# Patient Record
Sex: Male | Born: 1965 | Race: White | Hispanic: No | Marital: Married | Smoking: Never smoker
Health system: Southern US, Community
[De-identification: ages and names within clinical notes are randomized; demographics above are authoritative.]

## PROBLEM LIST (undated history)

## (undated) DIAGNOSIS — S62509A Fracture of unspecified phalanx of unspecified thumb, initial encounter for closed fracture: Secondary | ICD-10-CM

## (undated) DIAGNOSIS — K649 Unspecified hemorrhoids: Secondary | ICD-10-CM

## (undated) DIAGNOSIS — K219 Gastro-esophageal reflux disease without esophagitis: Secondary | ICD-10-CM

## (undated) DIAGNOSIS — S8290XA Unspecified fracture of unspecified lower leg, initial encounter for closed fracture: Secondary | ICD-10-CM

## (undated) DIAGNOSIS — K645 Perianal venous thrombosis: Secondary | ICD-10-CM

## (undated) HISTORY — PX: VASECTOMY: SHX75

## (undated) HISTORY — DX: Fracture of unspecified phalanx of unspecified thumb, initial encounter for closed fracture: S62.509A

## (undated) HISTORY — DX: Perianal venous thrombosis: K64.5

## (undated) HISTORY — DX: Unspecified hemorrhoids: K64.9

## (undated) HISTORY — DX: Unspecified fracture of unspecified lower leg, initial encounter for closed fracture: S82.90XA

## (undated) HISTORY — PX: INCISION AND DRAINAGE: SHX5863

## (undated) HISTORY — DX: Gastro-esophageal reflux disease without esophagitis: K21.9

---
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
## 1992-09-15 HISTORY — PX: ABSCESS DRAINAGE: SHX1119

## 2013-12-21 ENCOUNTER — Encounter: Payer: Self-pay | Admitting: *Deleted

## 2014-01-12 ENCOUNTER — Encounter: Payer: Self-pay | Admitting: Specialist

## 2014-02-11 ENCOUNTER — Encounter: Payer: Self-pay | Admitting: Specialist

## 2014-08-30 ENCOUNTER — Encounter: Payer: Self-pay | Admitting: *Deleted

## 2014-08-31 ENCOUNTER — Encounter: Payer: Self-pay | Admitting: *Deleted

## 2014-09-07 ENCOUNTER — Ambulatory Visit (INDEPENDENT_AMBULATORY_CARE_PROVIDER_SITE_OTHER): Payer: 59 | Admitting: General Surgery

## 2014-09-07 ENCOUNTER — Encounter: Payer: Self-pay | Admitting: General Surgery

## 2014-09-07 VITALS — BP 118/80 | HR 64 | Resp 12 | Ht 72.0 in | Wt 244.0 lb

## 2014-09-07 DIAGNOSIS — L989 Disorder of the skin and subcutaneous tissue, unspecified: Secondary | ICD-10-CM

## 2014-09-07 NOTE — Patient Instructions (Signed)
Keep area clean 

## 2014-09-07 NOTE — Progress Notes (Signed)
Patient ID: Ralph Patrick, male   DOB: [birth year], 48 y.o.   MRN: [id number]  Chief Complaint  Patient presents with  . Procedure    excision scalp lesion     HPI MCIHAEL Patrick is a 48 y.o. male. Here today to have a excision for scalp lesion removed. [name] has had a small pigmented lesion in left side of scalp. It has been a problem with shaving and also with mild local discomfort. HPI  Past Medical History  Diagnosis Date  . Thumb fracture   . Lower leg fracture 1969    right lower leg  . Hemorrhoid   . Thrombosed hemorrhoids 08-25-08 and 10-02-09  . GERD (gastroesophageal reflux disease)     Past Surgical History  Procedure Laterality Date  . Tonsillectomy  1972  . Vasectomy    . Abscess drainage  09-15-92    perianal  . Incision and drainage  2009, 2010    hemorrhoids    History reviewed. No pertinent family history.  Social History History  Substance Use Topics  . Smoking status: Never Smoker   . Smokeless tobacco: Never Used  . Alcohol Use: No    No Known Allergies  Current Outpatient Prescriptions  Medication Sig Dispense Refill  . docusate sodium (COLACE) 100 MG capsule Take 100 mg by mouth daily as needed for mild constipation.    Marland Kitchen omeprazole (PRILOSEC) 20 MG capsule Take 20 mg by mouth as needed.    . polyethylene glycol (MIRALAX / GLYCOLAX) packet Take 17 g by mouth daily.     No current facility-administered medications for this visit.    Review of Systems Review of Systems  Constitutional: Negative.   Respiratory: Negative.   Cardiovascular: Negative.     Blood pressure 118/80, pulse 64, resp. rate 12, height 6' (1.829 m), weight 244 lb (110.678 kg).  Physical Exam Physical Exam  Constitutional: He is oriented to person, place, and time. He appears well-developed and well-nourished.  Neurological: He is alert and oriented to person, place, and time.  Skin: Skin is warm and dry.  On left side of scalp there is a 4-46mm pink raised nodule.    Data Reviewed none  Assessment    Symptomatic scalp skin lesion.     Plan   Excision discussed and complete with his consent.    Procedure: Area infiltrated with 1%xylocaine mixed with 0.5% marcaine, 6ml. Prepped with Chloro Prep. Lesion excised with 49mm margin. Base cauterised with disposable cautery. Skin closed with interrupted 5-0 Prolene.  Neoporin oint, telfa and tegaderm. Sutures can be removed in 1 week. No immediate problems from procedure.  Follow up as needed.  [name] 09/12/2014, 6:33 AM

## 2014-09-09 LAB — PATHOLOGY

## 2014-09-12 ENCOUNTER — Telehealth: Payer: Self-pay | Admitting: *Deleted

## 2014-09-12 ENCOUNTER — Encounter: Payer: Self-pay | Admitting: General Surgery

## 2014-09-12 NOTE — Telephone Encounter (Signed)
Patient notified of pathology results and verbalizes understanding.

## 2014-09-12 NOTE — Telephone Encounter (Signed)
-----   Message from Christene Lye, MD sent at 09/12/2014  6:54 AM EST ----- Path reviewed.  Please inform pt - path is benign

## 2015-10-19 DIAGNOSIS — J069 Acute upper respiratory infection, unspecified: Secondary | ICD-10-CM | POA: Diagnosis not present

## 2015-10-19 DIAGNOSIS — H6982 Other specified disorders of Eustachian tube, left ear: Secondary | ICD-10-CM | POA: Diagnosis not present

## 2015-11-26 DIAGNOSIS — M545 Low back pain: Secondary | ICD-10-CM | POA: Diagnosis not present

## 2017-07-17 ENCOUNTER — Encounter: Payer: Self-pay | Admitting: General Surgery

## 2017-07-17 ENCOUNTER — Ambulatory Visit (INDEPENDENT_AMBULATORY_CARE_PROVIDER_SITE_OTHER): Payer: 59 | Admitting: General Surgery

## 2017-07-17 VITALS — BP 110/80 | HR 73 | Resp 12 | Ht 72.0 in | Wt 240.0 lb

## 2017-07-17 DIAGNOSIS — R202 Paresthesia of skin: Secondary | ICD-10-CM

## 2017-07-17 DIAGNOSIS — G629 Polyneuropathy, unspecified: Secondary | ICD-10-CM

## 2017-07-17 DIAGNOSIS — Z1211 Encounter for screening for malignant neoplasm of colon: Secondary | ICD-10-CM | POA: Diagnosis not present

## 2017-07-17 LAB — GLUCOSE, POCT (MANUAL RESULT ENTRY): POC Glucose: 104 mg/dl — AB (ref 70–99)

## 2017-07-17 MED ORDER — PREGABALIN 25 MG PO CAPS: 25.0000 mg | ORAL_CAPSULE | Freq: Two times a day (BID) | ORAL | 0 refills | Status: DC

## 2017-07-17 NOTE — Progress Notes (Signed)
Patient ID: Ralph Patrick, male   DOB: 1966/09/01, 51 y.o.   MRN: [id number]  Chief Complaint  Patient presents with  . Other    HPI Ralph Patrick is a 51 y.o. male here today for a evaluation of bilateral foot tingling and pain. Described as "bumble bee" stings and numbness. Patient states he started a new job about a month ago and the pain has got worse while walking . He has tried aleve and ibuprofen, and epsom salt soaks. He is wearing compression hose and bought new shoes as well to try to help the pain. He does admit to occasional "dizzy spells" yesterday and today.  HPI    Past Medical History:  Diagnosis Date  . GERD (gastroesophageal reflux disease)   . Hemorrhoid   . Lower leg fracture 1969   right lower leg  . Thrombosed hemorrhoids 08-25-08 and 10-02-09  . Thumb fracture     Past Surgical History:  Procedure Laterality Date  . ABSCESS DRAINAGE  09-15-92   perianal  . INCISION AND DRAINAGE  2009, 2010   hemorrhoids  . TONSILLECTOMY  1972  . VASECTOMY      No family history on file.  Social History Social History  Substance Use Topics  . Smoking status: Never Smoker  . Smokeless tobacco: Never Used  . Alcohol use No    No Known Allergies  Current Outpatient Prescriptions  Medication Sig Dispense Refill  . docusate sodium (COLACE) 100 MG capsule Take 100 mg by mouth daily as needed for mild constipation.    Marland Kitchen omeprazole (PRILOSEC) 20 MG capsule Take 20 mg by mouth as needed.    . polyethylene glycol (MIRALAX / GLYCOLAX) packet Take 17 g by mouth daily.    . pregabalin (LYRICA) 25 MG capsule Take 1 capsule (25 mg total) by mouth 2 (two) times daily. 60 capsule 0   No current facility-administered medications for this visit.     Review of Systems Review of Systems  Constitutional: Negative.   Respiratory: Negative.   Cardiovascular: Negative.     Blood pressure 110/80, pulse 73, resp. rate 12, height 6' (1.829 m), weight 240 lb (108.9  kg).  Physical Exam Physical Exam  Constitutional: He is oriented to person, place, and time. He appears well-developed and well-nourished.  Cardiovascular: Intact distal pulses.   Pulses:      Dorsalis pedis pulses are 2+ on the right side, and 2+ on the left side.       Posterior tibial pulses are 2+ on the right side, and 2+ on the left side.  No lower leg edema. Feet are warm, brisk cap refill. No VV or skin changes  Neurological: He is alert and oriented to person, place, and time.  Skin: Skin is warm and dry.  Psychiatric: His behavior is normal.    Data Reviewed   Assessment    Blood sugar 104 today- not a fasting sample. 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    Plan    Recommend trial of lyrica 25 mg BID #60. Continue compression hose. He is agreeable to getting established with a PCP and also a colonoscopy. Colonoscopy with possible biopsy/polypectomy prn: Information regarding the procedure, including its potential risks and complications (including but not limited to perforation of the bowel, which may require emergency surgery to repair, and bleeding) was verbally given to the  patient. Educational information regarding lower intestinal endoscopy was given to the patient. Written instructions for how to complete the bowel prep using Miralax were provided. The importance of drinking ample fluids to avoid dehydration as a result of the prep emphasized.  He will call back to schedule colonoscopy. Will arrange for PCP      HPI, Physical Exam, Assessment and Plan have been scribed under the direction and in the presence of Ralph Jewel, MD  Ralph Patrick, CMA  I have completed the exam and reviewed the above documentation for accuracy and completeness.  I agree with the above.  Haematologist has been used and any errors in dictation or  transcription are unintentional.  Ralph Patrick G. Jamal Collin, M.D., F.A.C.S.   Ralph Patrick G 07/18/2017, 10:25 AM

## 2017-07-17 NOTE — Patient Instructions (Signed)
Colonoscopy, Adult A colonoscopy is an exam to look at the entire large intestine. During the exam, a lubricated, bendable tube is inserted into the anus and then passed into the rectum, colon, and other parts of the large intestine. A colonoscopy is often done as a part of normal colorectal screening or in response to certain symptoms, such as anemia, persistent diarrhea, abdominal pain, and blood in the stool. The exam can help screen for and diagnose medical problems, including:  Tumors.  Polyps.  Inflammation.  Areas of bleeding.  Tell a health care provider about:  Any allergies you have.  All medicines you are taking, including vitamins, herbs, eye drops, creams, and over-the-counter medicines.  Any problems you or family members have had with anesthetic medicines.  Any blood disorders you have.  Any surgeries you have had.  Any medical conditions you have.  Any problems you have had passing stool. What are the risks? Generally, this is a safe procedure. However, problems may occur, including:  Bleeding.  A tear in the intestine.  A reaction to medicines given during the exam.  Infection (rare).  What happens before the procedure? Eating and drinking restrictions Follow instructions from your health care provider about eating and drinking, which may include:  A few days before the procedure - follow a low-fiber diet. Avoid nuts, seeds, dried fruit, raw fruits, and vegetables.  1-3 days before the procedure - follow a clear liquid diet. Drink only clear liquids, such as clear broth or bouillon, black coffee or tea, clear juice, clear soft drinks or sports drinks, gelatin dessert, and popsicles. Avoid any liquids that contain red or purple dye.  On the day of the procedure - do not eat or drink anything during the 2 hours before the procedure, or within the time period that your health care provider recommends.  Bowel prep If you were prescribed an oral bowel prep  to clean out your colon:  Take it as told by your health care provider. Starting the day before your procedure, you will need to drink a large amount of medicated liquid. The liquid will cause you to have multiple loose stools until your stool is almost clear or light green.  If your skin or anus gets irritated from diarrhea, you may use these to relieve the irritation: ? Medicated wipes, such as adult wet wipes with aloe and vitamin E. ? A skin soothing-product like petroleum jelly.  If you vomit while drinking the bowel prep, take a break for up to 60 minutes and then begin the bowel prep again. If vomiting continues and you cannot take the bowel prep without vomiting, call your health care provider.  General instructions  Ask your health care provider about changing or stopping your regular medicines. This is especially important if you are taking diabetes medicines or blood thinners.  Plan to have someone take you home from the hospital or clinic. What happens during the procedure?  An IV tube may be inserted into one of your veins.  You will be given medicine to help you relax (sedative).  To reduce your risk of infection: ? Your health care team will wash or sanitize their hands. ? Your anal area will be washed with soap.  You will be asked to lie on your side with your knees bent.  Your health care provider will lubricate a long, thin, flexible tube. The tube will have a camera and a light on the end.  The tube will be inserted into your   anus.  The tube will be gently eased through your rectum and colon.  Air will be delivered into your colon to keep it open. You may feel some pressure or cramping.  The camera will be used to take images during the procedure.  A small tissue sample may be removed from your body to be examined under a microscope (biopsy). If any potential problems are found, the tissue will be sent to a lab for testing.  If small polyps are found, your  health care provider may remove them and have them checked for cancer cells.  The tube that was inserted into your anus will be slowly removed. The procedure may vary among health care providers and hospitals. What happens after the procedure?  Your blood pressure, heart rate, breathing rate, and blood oxygen level will be monitored until the medicines you were given have worn off.  Do not drive for 24 hours after the exam.  You may have a small amount of blood in your stool.  You may pass gas and have mild abdominal cramping or bloating due to the air that was used to inflate your colon during the exam.  It is up to you to get the results of your procedure. Ask your health care provider, or the department performing the procedure, when your results will be ready. This information is not intended to replace advice given to you by your health care provider. Make sure you discuss any questions you have with your health care provider. Document Released: 09/27/2000 Document Revised: 07/31/2016 Document Reviewed: 12/12/2015 Elsevier Interactive Patient Education  2018 Elsevier Inc[employer]  

## 2017-08-12 ENCOUNTER — Ambulatory Visit: Payer: Self-pay | Admitting: Family Medicine

## 2017-08-13 ENCOUNTER — Ambulatory Visit: Payer: Self-pay | Admitting: Family Medicine

## 2017-09-10 ENCOUNTER — Ambulatory Visit: Payer: Self-pay | Admitting: Family Medicine

## 2017-09-17 ENCOUNTER — Telehealth: Payer: Self-pay | Admitting: *Deleted

## 2017-09-17 MED ORDER — LIDOCAINE HCL 2 % EX GEL: 1.0000 "application " | CUTANEOUS | 1 refills | Status: DC | PRN

## 2017-09-17 NOTE — Telephone Encounter (Signed)
Rx sent 

## 2017-09-22 ENCOUNTER — Ambulatory Visit: Payer: Self-pay | Admitting: Family Medicine

## 2017-09-25 ENCOUNTER — Encounter: Payer: Self-pay | Admitting: General Surgery

## 2017-09-25 ENCOUNTER — Ambulatory Visit (INDEPENDENT_AMBULATORY_CARE_PROVIDER_SITE_OTHER): Payer: 59 | Admitting: General Surgery

## 2017-09-25 VITALS — BP 122/68 | HR 65 | Resp 12 | Ht 72.0 in | Wt 242.0 lb

## 2017-09-25 DIAGNOSIS — K645 Perianal venous thrombosis: Secondary | ICD-10-CM

## 2017-09-25 NOTE — Patient Instructions (Signed)
The patient is aware to call back for any questions or concerns.  

## 2017-09-25 NOTE — Progress Notes (Signed)
Patient ID: Ralph Patrick, male   DOB: 06/22/1966, 51 y.o.   MRN: [id number]  Chief Complaint  Patient presents with  . Follow-up    HPI Ralph Patrick is a 51 y.o. male.  Here for follow up thrombosed hemorrhoid. He states he has improved. About 10-12 days ago the patient had presented with an acutely inflamed hemorrhoid on the left side.  And also had multiple clots.  That time the hemorrhoid was lanced and several pieces of clots were removed.  He has since been using and out pram cream and it has gradually improved but not fully resolved. HPI  Past Medical History:  Diagnosis Date  . GERD (gastroesophageal reflux disease)   . Hemorrhoid   . Lower leg fracture 1969   right lower leg  . Thrombosed hemorrhoids 08-25-08 and 10-02-09  . Thumb fracture     Past Surgical History:  Procedure Laterality Date  . ABSCESS DRAINAGE  09-15-92   perianal  . INCISION AND DRAINAGE  2009, 2010   hemorrhoids  . TONSILLECTOMY  1972  . VASECTOMY      No family history on file.  Social History Social History   Tobacco Use  . Smoking status: Never Smoker  . Smokeless tobacco: Never Used  Substance Use Topics  . Alcohol use: No    Alcohol/week: 0.0 oz  . Drug use: No    No Known Allergies  Current Outpatient Medications  Medication Sig Dispense Refill  . docusate sodium (COLACE) 100 MG capsule Take 100 mg by mouth daily as needed for mild constipation.    . lidocaine (XYLOCAINE) 2 % jelly Apply 1 application topically as needed. 30 mL 1  . omeprazole (PRILOSEC) 20 MG capsule Take 20 mg by mouth as needed.    . polyethylene glycol (MIRALAX / GLYCOLAX) packet Take 17 g by mouth daily.    . pregabalin (LYRICA) 25 MG capsule Take 1 capsule (25 mg total) by mouth 2 (two) times daily. (Patient taking differently: Take 25 mg by mouth as needed. ) 60 capsule 0   No current facility-administered medications for this visit.     Review of Systems Review of Systems  Constitutional:  Negative.   Respiratory: Negative.   Cardiovascular: Negative.     Blood pressure 122/68, pulse 65, resp. rate 12, height 6' (1.829 m), weight 242 lb (109.8 kg).  Physical Exam Physical Exam  Constitutional: He is oriented to person, place, and time. He appears well-developed and well-nourished.  Neurological: He is alert and oriented to person, place, and time.  Skin: Skin is warm and dry.  Psychiatric: His behavior is normal.  Rectal exam shows again a prominent left external hemorrhoid which is still mildly inflamed but much smaller than it was last time.  The small opening that was made for clots to be removed was still identified and with consent this was reopened and several more pieces of clots were removed. Data Reviewed   Assessment    Episode of thrombosed external hemorrhoid on the left posterior incision and evacuation of clots- it is improving.  Advised to continue using the prime cream until this is fully resolved.  No need for surgical intervention since he has had only 2 episodes of this problem over many years now    Plan    Follow-up as needed.  Discussed colonoscopy or at least doing a colon guard testing.  Patient did decide on this     HPI, Physical Exam, Assessment and Plan have been  scribed under the direction and in the presence of Mckinley Jewel, MD  Gaspar Cola, CMA I have completed the exam and reviewed the above documentation for accuracy and completeness.  I agree with the above.  Haematologist has been used and any errors in dictation or transcription are unintentional.  Seeplaputhur G. Jamal Collin, M.D., F.A.C.S.   Junie Panning G 09/29/2017, 9:53 AM

## 2017-10-16 ENCOUNTER — Encounter: Payer: Self-pay | Admitting: Family Medicine

## 2017-10-16 ENCOUNTER — Other Ambulatory Visit: Payer: Self-pay

## 2017-10-16 ENCOUNTER — Ambulatory Visit (INDEPENDENT_AMBULATORY_CARE_PROVIDER_SITE_OTHER): Payer: 59 | Admitting: Family Medicine

## 2017-10-16 VITALS — BP 108/62 | HR 68 | Temp 98.2°F | Resp 14 | Ht 72.0 in | Wt 243.8 lb

## 2017-10-16 DIAGNOSIS — Z1211 Encounter for screening for malignant neoplasm of colon: Secondary | ICD-10-CM

## 2017-10-16 DIAGNOSIS — Z7689 Persons encountering health services in other specified circumstances: Secondary | ICD-10-CM | POA: Diagnosis not present

## 2017-10-16 DIAGNOSIS — Z125 Encounter for screening for malignant neoplasm of prostate: Secondary | ICD-10-CM | POA: Diagnosis not present

## 2017-10-16 DIAGNOSIS — K219 Gastro-esophageal reflux disease without esophagitis: Secondary | ICD-10-CM | POA: Diagnosis not present

## 2017-10-16 DIAGNOSIS — Z0000 Encounter for general adult medical examination without abnormal findings: Secondary | ICD-10-CM

## 2017-10-16 DIAGNOSIS — R0683 Snoring: Secondary | ICD-10-CM

## 2017-10-16 DIAGNOSIS — Z6833 Body mass index (BMI) 33.0-33.9, adult: Secondary | ICD-10-CM | POA: Diagnosis not present

## 2017-10-16 LAB — POCT URINALYSIS DIPSTICK
Bilirubin, UA: NEGATIVE
Blood, UA: NEGATIVE
Glucose, UA: NEGATIVE
Ketones, UA: NEGATIVE
Leukocytes, UA: NEGATIVE
Nitrite, UA: NEGATIVE
Protein, UA: NEGATIVE
Spec Grav, UA: 1.015
Urobilinogen, UA: 0.2 U/dL
pH, UA: 6

## 2017-10-16 NOTE — Progress Notes (Signed)
Ralph Patrick  MRN: [id number] DOB: 24-Apr-1966  Subjective:  HPI  Patient is here to get established. Patient has not had a PCP for years. Saw Dr Glennon Hamilton years ago. Patient states he is not having any particular issues. Patient is ready to get Colonoscopy set up.For CPE today.He is married,works as Production assistant, radio for [employer] and has daughter -24,and son--19.  Wt Readings from Last 3 Encounters:  10/16/17 243 lb 12.8 oz (110.6 kg)  09/25/17 242 lb (109.8 kg)  07/17/17 240 lb (108.9 kg)    Patient Active Problem List   Diagnosis Date Noted  . GERD (gastroesophageal reflux disease) 10/16/2017    Past Medical History:  Diagnosis Date  . GERD (gastroesophageal reflux disease)   . Hemorrhoid   . Lower leg fracture 1969   right lower leg  . Thrombosed hemorrhoids 08-25-08 and 10-02-09  . Thumb fracture     Social History   Socioeconomic History  . Marital status: Married    Spouse name: Not on file  . Number of children: Not on file  . Years of education: Not on file  . Highest education level: Not on file  Social Needs  . Financial resource strain: Not on file  . Food insecurity - worry: Not on file  . Food insecurity - inability: Not on file  . Transportation needs - medical: Not on file  . Transportation needs - non-medical: Not on file  Occupational History  . Not on file  Tobacco Use  . Smoking status: Never Smoker  . Smokeless tobacco: Never Used  Substance and Sexual Activity  . Alcohol use: No    Alcohol/week: 0.0 oz  . Drug use: No  . Sexual activity: Not on file  Other Topics Concern  . Not on file  Social History Narrative  . Not on file    Outpatient Encounter Medications as of 10/16/2017  Medication Sig  . calcium carbonate (TUMS) 500 MG chewable tablet Chew 1 tablet by mouth daily as needed for indigestion or heartburn.  . docusate sodium (COLACE) 100 MG capsule Take 100 mg by mouth daily as needed for mild constipation.  . [DISCONTINUED]  lidocaine (XYLOCAINE) 2 % jelly Apply 1 application topically as needed.  . [DISCONTINUED] omeprazole (PRILOSEC) 20 MG capsule Take 20 mg by mouth as needed.  . [DISCONTINUED] polyethylene glycol (MIRALAX / GLYCOLAX) packet Take 17 g by mouth daily.  . [DISCONTINUED] pregabalin (LYRICA) 25 MG capsule Take 1 capsule (25 mg total) by mouth 2 (two) times daily. (Patient taking differently: Take 25 mg by mouth as needed. )   No facility-administered encounter medications on file as of 10/16/2017.     No Known Allergies  Review of Systems  Constitutional: Negative.   HENT: Positive for hearing loss.   Respiratory: Negative.   Cardiovascular: Negative.   Gastrointestinal: Positive for heartburn. Negative for nausea and vomiting.  Musculoskeletal: Negative.   Neurological: Negative.     Objective:  BP 108/62   Pulse 68   Temp 98.2 F (36.8 C)   Resp 14   Ht 6' (1.829 m)   Wt 243 lb 12.8 oz (110.6 kg)   BMI 33.07 kg/m   Physical Exam  Constitutional: He is oriented to person, place, and time and well-developed, well-nourished, and in no distress.  HENT:  Head: Normocephalic and atraumatic.  Right Ear: External ear normal.  Left Ear: External ear normal.  Nose: Nose normal.  Mouth/Throat: Oropharynx is clear and moist.  Eyes: Conjunctivae are normal.  Pupils are equal, round, and reactive to light. No scleral icterus.  Neck: Neck supple. No thyromegaly present.  Cardiovascular: Normal rate, regular rhythm, normal heart sounds and intact distal pulses.  Pulmonary/Chest: Effort normal and breath sounds normal.  Abdominal: Soft.  Genitourinary: Rectum normal, prostate normal and penis normal.  Musculoskeletal: Normal range of motion.  Lymphadenopathy:    He has no cervical adenopathy.  Neurological: He is alert and oriented to person, place, and time. Gait normal. GCS score is 15.  Skin: Skin is warm.  Psychiatric: Mood, memory, affect and judgment normal.    Assessment and Plan  :  1. Encounter to establish care Patient advised to provide records of his immunizations-Tdap and hepatitis series. 2. Annual physical exam - CBC with Differential/Platelet - Lipid Panel With LDL/HDL Ratio - POCT Urinalysis Dipstick - TSH - Comprehensive metabolic panel - EKG [id number] baseline RTC 1 year. 3. Gastroesophageal reflux disease, esophagitis presence not specified  4. BMI 33.0-33.9,adult Work on habits 5. Colon cancer screening Refer for colonoscopy - Ambulatory referral to Gastroenterology  6. Prostate cancer screening - PSA  7. Snoring Epworth score today is 11. Patient advised to ask his wife if he has apnea. Follow for now.May need sleep study.  HPI, Exam and A&P transcribed by Tiffany Kocher, RMA under direction and in the presence of Miguel Aschoff, MD. I have done the exam and reviewed the chart and it is accurate to the best of my knowledge. Development worker, community has been used and  any errors in dictation or transcription are unintentional. Miguel Aschoff M.D. [facility]

## 2017-10-21 DIAGNOSIS — Z0000 Encounter for general adult medical examination without abnormal findings: Secondary | ICD-10-CM | POA: Diagnosis not present

## 2017-10-21 DIAGNOSIS — Z125 Encounter for screening for malignant neoplasm of prostate: Secondary | ICD-10-CM | POA: Diagnosis not present

## 2017-10-22 LAB — COMPREHENSIVE METABOLIC PANEL
ALT: 25 IU/L (ref 0–44)
AST: 27 IU/L (ref 0–40)
Albumin/Globulin Ratio: 1.6 (ref 1.2–2.2)
Albumin: 4.8 g/dL (ref 3.5–5.5)
Alkaline Phosphatase: 76 IU/L (ref 39–117)
BUN/Creatinine Ratio: 13 (ref 9–20)
BUN: 13 mg/dL (ref 6–24)
Bilirubin Total: 0.5 mg/dL (ref 0.0–1.2)
CO2: 22 mmol/L (ref 20–29)
Calcium: 9.5 mg/dL (ref 8.7–10.2)
Chloride: 103 mmol/L (ref 96–106)
Creatinine, Ser: 1 mg/dL (ref 0.76–1.27)
GFR calc Af Amer: 100 mL/min/{1.73_m2} (ref >59)
GFR calc non Af Amer: 86 mL/min/{1.73_m2} (ref >59)
Globulin, Total: 3 g/dL (ref 1.5–4.5)
Glucose: 104 mg/dL — ABNORMAL HIGH (ref 65–99)
Potassium: 4.6 mmol/L (ref 3.5–5.2)
Sodium: 144 mmol/L (ref 134–144)
Total Protein: 7.8 g/dL (ref 6.0–8.5)

## 2017-10-22 LAB — PSA: Prostate Specific Ag, Serum: 0.9 ng/mL (ref 0.0–4.0)

## 2017-10-22 LAB — CBC WITH DIFFERENTIAL/PLATELET
Basophils Absolute: 0 10*3/uL (ref 0.0–0.2)
Basos: 0 %
EOS (ABSOLUTE): 0.2 10*3/uL (ref 0.0–0.4)
Eos: 3 %
Hematocrit: 52 % — ABNORMAL HIGH (ref 37.5–51.0)
Hemoglobin: 17.6 g/dL (ref 13.0–17.7)
Immature Grans (Abs): 0 10*3/uL (ref 0.0–0.1)
Immature Granulocytes: 0 %
Lymphocytes Absolute: 2.3 10*3/uL (ref 0.7–3.1)
Lymphs: 32 %
MCH: 32.3 pg (ref 26.6–33.0)
MCHC: 33.8 g/dL (ref 31.5–35.7)
MCV: 95 fL (ref 79–97)
Monocytes Absolute: 0.5 10*3/uL (ref 0.1–0.9)
Monocytes: 7 %
Neutrophils Absolute: 4 10*3/uL (ref 1.4–7.0)
Neutrophils: 58 %
Platelets: 237 10*3/uL (ref 150–379)
RBC: 5.45 x10E6/uL (ref 4.14–5.80)
RDW: 14.1 % (ref 12.3–15.4)
WBC: 7 10*3/uL (ref 3.4–10.8)

## 2017-10-22 LAB — LIPID PANEL WITH LDL/HDL RATIO
Cholesterol, Total: 234 mg/dL — ABNORMAL HIGH (ref 100–199)
HDL: 44 mg/dL (ref >39)
LDL Calculated: 160 mg/dL — ABNORMAL HIGH (ref 0–99)
LDl/HDL Ratio: 3.6 ratio (ref 0.0–3.6)
Triglycerides: 152 mg/dL — ABNORMAL HIGH (ref 0–149)
VLDL Cholesterol Cal: 30 mg/dL (ref 5–40)

## 2017-10-22 LAB — TSH: TSH: 3.27 u[IU]/mL (ref 0.450–4.500)

## 2017-10-22 NOTE — Progress Notes (Signed)
Advised  ED 

## 2017-12-02 ENCOUNTER — Telehealth: Payer: Self-pay | Admitting: Family Medicine

## 2017-12-02 NOTE — Telephone Encounter (Signed)
Please review. Thanks!  

## 2017-12-02 NOTE — Telephone Encounter (Signed)
FYI--Pt did not want to schedule colonoscopy that was ordered in Jan

## 2017-12-18 ENCOUNTER — Ambulatory Visit (INDEPENDENT_AMBULATORY_CARE_PROVIDER_SITE_OTHER): Payer: 59 | Admitting: Family Medicine

## 2017-12-18 ENCOUNTER — Encounter: Payer: Self-pay | Admitting: Family Medicine

## 2017-12-18 VITALS — BP 100/74 | HR 73 | Temp 97.7°F | Resp 16 | Wt 244.0 lb

## 2017-12-18 DIAGNOSIS — J069 Acute upper respiratory infection, unspecified: Secondary | ICD-10-CM

## 2017-12-18 DIAGNOSIS — R0981 Nasal congestion: Secondary | ICD-10-CM | POA: Diagnosis not present

## 2017-12-18 DIAGNOSIS — H65111 Acute and subacute allergic otitis media (mucoid) (sanguinous) (serous), right ear: Secondary | ICD-10-CM

## 2017-12-18 DIAGNOSIS — H65191 Other acute nonsuppurative otitis media, right ear: Secondary | ICD-10-CM

## 2017-12-18 MED ORDER — AMOXICILLIN-POT CLAVULANATE 875-125 MG PO TABS: 1.0000 | ORAL_TABLET | Freq: Two times a day (BID) | ORAL | 0 refills | Status: AC

## 2017-12-18 NOTE — Patient Instructions (Signed)

## 2017-12-18 NOTE — Progress Notes (Signed)
Patient: Ralph Patrick Male    DOB: [birth year]   52 y.o.   MRN: [id number] Visit Date: 12/18/2017  Today's Provider: Lavon Paganini, MD   I, Martha Clan, CMA, am acting as scribe for Lavon Paganini, MD.  Chief Complaint  Patient presents with  . URI   Subjective:    URI   This is a new problem. Episode onset: x 3 days. The problem has been unchanged. There has been no fever. Associated symptoms include coughing, ear pain, rhinorrhea, sinus pain (frontal), sneezing and a sore throat. Pertinent negatives include no abdominal pain, chest pain, congestion, diarrhea, dysuria, headaches, nausea, neck pain, plugged ear sensation, swollen glands, vomiting or wheezing. Associated symptoms comments: States the inside of head "feels like an oven".. Treatments tried: Mucinex Day And Night, nassal spray, antihistamine. The treatment provided no relief.      No Known Allergies   Current Outpatient Medications:  .  calcium carbonate (TUMS) 500 MG chewable tablet, Chew 1 tablet by mouth daily as needed for indigestion or heartburn., Disp: , Rfl:  .  docusate sodium (COLACE) 100 MG capsule, Take 100 mg by mouth daily as needed for mild constipation., Disp: , Rfl:   Review of Systems  HENT: Positive for ear pain, rhinorrhea, sinus pain (frontal), sneezing and sore throat. Negative for congestion.   Respiratory: Positive for cough. Negative for wheezing.   Cardiovascular: Negative for chest pain.  Gastrointestinal: Negative for abdominal pain, diarrhea, nausea and vomiting.  Genitourinary: Negative for dysuria.  Musculoskeletal: Negative for neck pain.  Neurological: Negative for headaches.    Social History   Tobacco Use  . Smoking status: Never Smoker  . Smokeless tobacco: Never Used  Substance Use Topics  . Alcohol use: No    Alcohol/week: 0.0 oz   Objective:   BP 100/74 (BP Location: Left Arm, Patient Position: Sitting, Cuff Size: Large)   Pulse 73   Temp 97.7 F  (36.5 C) (Oral)   Resp 16   Wt 244 lb (110.7 kg)   SpO2 97%   BMI 33.09 kg/m  Vitals:   12/18/17 1518  BP: 100/74  Pulse: 73  Resp: 16  Temp: 97.7 F (36.5 C)  TempSrc: Oral  SpO2: 97%  Weight: 244 lb (110.7 kg)     Physical Exam  Constitutional: He is oriented to person, place, and time. He appears well-developed and well-nourished.  HENT:  Head: Normocephalic and atraumatic.  Right Ear: External ear and ear canal normal. Tympanic membrane is erythematous and bulging.  Left Ear: External ear and ear canal normal. Tympanic membrane is erythematous.  Nose: Mucosal edema and rhinorrhea present. Right sinus exhibits frontal sinus tenderness. Right sinus exhibits no maxillary sinus tenderness. Left sinus exhibits frontal sinus tenderness. Left sinus exhibits no maxillary sinus tenderness.  Mouth/Throat: Uvula is midline and mucous membranes are normal. Posterior oropharyngeal erythema present. No oropharyngeal exudate or posterior oropharyngeal edema.  Eyes: Conjunctivae and EOM are normal. Pupils are equal, round, and reactive to light. Right eye exhibits no discharge. Left eye exhibits no discharge. No scleral icterus.  Neck: Neck supple. No thyromegaly present.  Cardiovascular: Normal rate, regular rhythm, normal heart sounds and intact distal pulses.  No murmur heard. Pulmonary/Chest: Effort normal and breath sounds normal. No respiratory distress. He has no wheezes. He has no rales.  Musculoskeletal: He exhibits no edema.  Lymphadenopathy:    He has no cervical adenopathy.  Neurological: He is alert and oriented to person, place, and  time.  Skin: Skin is warm and dry. No rash noted.  Psychiatric: He has a normal mood and affect. His behavior is normal.  Vitals reviewed.      Assessment & Plan:     1. Acute mucoid otitis media of right ear - exam c/w AOM of R ear with possible early changes in L ear - will treat with 7d course of Augmentin - discussed return  precautions  2. Viral URI - suspect viral URI was initial infection that caused URI symptoms and lead to AOM - symptomatic treatment, natural course, return precautions discussed  3. Sinus congestion - suspect related to AOM and viral URIs - continue mucinex/nasal spray - if early sinusitis, augmentin will help this as well    Meds ordered this encounter  Medications  . amoxicillin-clavulanate (AUGMENTIN) 875-125 MG tablet    Sig: Take 1 tablet by mouth 2 (two) times daily for 7 days.    Dispense:  14 tablet    Refill:  0     Return if symptoms worsen or fail to improve.   The entirety of the information documented in the History of Present Illness, Review of Systems and Physical Exam were personally obtained by me. Portions of this information were initially documented by Raquel Sarna Ratchford, CMA and reviewed by me for thoroughness and accuracy.    Virginia Crews, MD, MPH [facility] 12/18/2017 3:40 PM

## 2018-04-14 DIAGNOSIS — H524 Presbyopia: Secondary | ICD-10-CM | POA: Diagnosis not present

## 2018-04-14 DIAGNOSIS — H5213 Myopia, bilateral: Secondary | ICD-10-CM | POA: Diagnosis not present

## 2018-04-14 DIAGNOSIS — H52221 Regular astigmatism, right eye: Secondary | ICD-10-CM | POA: Diagnosis not present

## 2018-05-18 ENCOUNTER — Encounter: Payer: Self-pay | Admitting: Podiatry

## 2018-05-18 ENCOUNTER — Ambulatory Visit (INDEPENDENT_AMBULATORY_CARE_PROVIDER_SITE_OTHER): Payer: 59 | Admitting: Podiatry

## 2018-05-18 ENCOUNTER — Ambulatory Visit (INDEPENDENT_AMBULATORY_CARE_PROVIDER_SITE_OTHER): Payer: 59

## 2018-05-18 ENCOUNTER — Ambulatory Visit: Payer: 59

## 2018-05-18 ENCOUNTER — Other Ambulatory Visit: Payer: Self-pay | Admitting: Podiatry

## 2018-05-18 VITALS — BP 104/61 | HR 57

## 2018-05-18 DIAGNOSIS — D3610 Benign neoplasm of peripheral nerves and autonomic nervous system, unspecified: Secondary | ICD-10-CM | POA: Diagnosis not present

## 2018-05-18 DIAGNOSIS — M205X9 Other deformities of toe(s) (acquired), unspecified foot: Secondary | ICD-10-CM

## 2018-05-18 DIAGNOSIS — M779 Enthesopathy, unspecified: Secondary | ICD-10-CM

## 2018-05-18 DIAGNOSIS — M79672 Pain in left foot: Secondary | ICD-10-CM

## 2018-05-18 DIAGNOSIS — M2020 Hallux rigidus, unspecified foot: Secondary | ICD-10-CM

## 2018-05-18 MED ORDER — MELOXICAM 7.5 MG PO TABS: 7.5000 mg | ORAL_TABLET | Freq: Every day | ORAL | 0 refills | Status: DC

## 2018-05-18 NOTE — Progress Notes (Signed)
This patient presents the office with chief complaint of pain under the left forefoot.  He says that the pain has been present for approximately 7-8 months. He says that he walks on concrete floors and the pain develops after significant walking and activity. He points to an area underneath the ball of the left foot as the site of maximum pain.  He also says that he feels nervelike pain shooting into the second and third toes, left foot.  He says that he has tried multiple pairs of insoles but the pain continues.  He denies any history of trauma or injury to the foot.  He has taken ibuprofen for pain, but the problem continues.  He presents the office today for definitive evaluation and treatment of his painful left forefoot.  General Appearance  Alert, conversant and in no acute stress.  Vascular  Dorsalis pedis and posterior tibial  pulses are palpable  bilaterally.  Capillary return is within normal limits  bilaterally. Temperature is within normal limits  bilaterally.  Neurologic  Senn-Weinstein monofilament wire test within normal limits  bilaterally. Muscle power within normal limits bilaterally.  Nails normal nails noted with no evidence of any fungal or bacterial infection  Orthopedic  No limitations of motion of motion feet .  No crepitus or effusions noted.  Palpable pain second interspace left foot.   Functional hallux limitus with significant dorsal exostosis first metatarsal  B/L.  Palpable pain sub 2 left foot.  Skin  normotropic skin with no porokeratosis noted bilaterally.  No signs of infections or ulcers noted.    Functional hallux limitus 1st MPJ  B/L  Capsulitis sub 2 left foot.  Neuroma second interspace left foot.  IE.  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  No bony pathology noted.  Discussed this condition with this patient.  Explained functional hallux limitus, which has caused his capsulitis  and neuroma.  Injection therapy provided for the second metatarsal capsulitis.  Patient was also told to make an appointment with Liliane Channel for  kinetic wedge orthotics for him to wear in his shoes.  These  orthoses will be beneficial for his right foot, which also has a functional hallux limitus.  Prescribed Mobic to be taken orally. RTC 3 weeks.   Gardiner Barefoot DPM

## 2018-05-26 ENCOUNTER — Ambulatory Visit (INDEPENDENT_AMBULATORY_CARE_PROVIDER_SITE_OTHER): Payer: 59 | Admitting: Orthotics

## 2018-05-26 DIAGNOSIS — M205X1 Other deformities of toe(s) (acquired), right foot: Secondary | ICD-10-CM | POA: Diagnosis not present

## 2018-05-26 DIAGNOSIS — M205X2 Other deformities of toe(s) (acquired), left foot: Secondary | ICD-10-CM | POA: Diagnosis not present

## 2018-05-26 DIAGNOSIS — M2020 Hallux rigidus, unspecified foot: Secondary | ICD-10-CM

## 2018-05-26 DIAGNOSIS — M205X9 Other deformities of toe(s) (acquired), unspecified foot: Secondary | ICD-10-CM

## 2018-05-26 DIAGNOSIS — M79672 Pain in left foot: Secondary | ICD-10-CM

## 2018-05-26 DIAGNOSIS — D3610 Benign neoplasm of peripheral nerves and autonomic nervous system, unspecified: Secondary | ICD-10-CM

## 2018-05-26 NOTE — Progress Notes (Signed)
Patient came into today to be cast for Custom Foot Orthotics. Upon recommendation of Dr. Prudence Davidson  Patient presents with b/l FHL, capsuliitis 2 left, neuroma 2 left Goals are plantarflexing first ray, offlloading 2, padding proximal. Plan vendor [facility]

## 2018-06-08 ENCOUNTER — Encounter: Payer: Self-pay | Admitting: Podiatry

## 2018-06-08 ENCOUNTER — Ambulatory Visit (INDEPENDENT_AMBULATORY_CARE_PROVIDER_SITE_OTHER): Payer: 59 | Admitting: Podiatry

## 2018-06-08 DIAGNOSIS — M779 Enthesopathy, unspecified: Secondary | ICD-10-CM | POA: Diagnosis not present

## 2018-06-08 DIAGNOSIS — M205X9 Other deformities of toe(s) (acquired), unspecified foot: Secondary | ICD-10-CM | POA: Diagnosis not present

## 2018-06-08 DIAGNOSIS — M2020 Hallux rigidus, unspecified foot: Secondary | ICD-10-CM

## 2018-06-08 NOTE — Progress Notes (Signed)
This patient returns to the office for continued evaluation of his left foot.  He was diagnosed as having a capsulitis sub-2 due to a functional hallux limitus first MPJ bilaterally.  I treated him with injection therapy in the second MPJ and he says that he was completely pain free.  He says the pain has started to return and is 2 out of 10 today. He says he did see and was measured for orthoses with Liliane Channel.  He presents the office today for continued evaluation of his left foot.  He says he wants to make sure that his severe pain doesn't return.  General Appearance  Alert, conversant and in no acute stress.  Vascular  Dorsalis pedis and posterior tibial  pulses are palpable  bilaterally.  Capillary return is within normal limits  bilaterally. Temperature is within normal limits  bilaterally.  Neurologic  Senn-Weinstein monofilament wire test within normal limits  bilaterally. Muscle power within normal limits bilaterally.  Nails normal nails noted with no evidence of bacterial or fungal infection.  Orthopedic  No limitations of motion of  feet .  No crepitus or effusions noted.  No bony pathology or digital deformities noted .Functional hallux limitus first MPJ bilaterally.palpable pain, sub-2, left foot.  Skin  normotropic skin with no porokeratosis noted bilaterally.  No signs of infections or ulcers noted.    Capsulitis sub 2 left.  FHL  B/L.  ROV.  Discussed this condition with this patient.  Since his pain level was 2 out of 10. We decided that he should take Mobic.  After reviewing his chart. I notice he had been prescribed Mobic at his previous visit.  Therefore, he will pick up the prescription that was initially prescribed 2 weeks ago.  RTC 3 weeks.   Gardiner Barefoot DPM

## 2018-06-11 ENCOUNTER — Telehealth: Payer: Self-pay | Admitting: Podiatry

## 2018-06-11 ENCOUNTER — Other Ambulatory Visit: Payer: Self-pay

## 2018-06-11 MED ORDER — MELOXICAM 7.5 MG PO TABS: 7.5000 mg | ORAL_TABLET | Freq: Two times a day (BID) | ORAL | 1 refills | Status: DC

## 2018-06-11 NOTE — Telephone Encounter (Signed)
I'm calling on behalf of Ralph Patrick. Dr. Prudence Davidson was supposed to be prescribing a new medication for him to try for the pain and there is nothing at the pharmacy. If you could let us know about that. [facility] number is [phone] and my name is Rosann Auerbach. Thank you.

## 2018-06-11 NOTE — Telephone Encounter (Signed)
I spoke with patient and he stated that he would like a refill of Meloxicam.  Per Dr. Burnell Blanks verbal order, ok to increase dose, give 7.5mg  1 bid.   Script has been sent to pharmacy and patient is aware of rx

## 2018-06-22 ENCOUNTER — Ambulatory Visit (INDEPENDENT_AMBULATORY_CARE_PROVIDER_SITE_OTHER): Payer: 59 | Admitting: Orthotics

## 2018-06-22 DIAGNOSIS — M205X9 Other deformities of toe(s) (acquired), unspecified foot: Secondary | ICD-10-CM

## 2018-06-22 DIAGNOSIS — M79672 Pain in left foot: Secondary | ICD-10-CM

## 2018-06-22 DIAGNOSIS — D3610 Benign neoplasm of peripheral nerves and autonomic nervous system, unspecified: Secondary | ICD-10-CM

## 2018-06-22 DIAGNOSIS — M779 Enthesopathy, unspecified: Secondary | ICD-10-CM

## 2018-06-22 DIAGNOSIS — M2020 Hallux rigidus, unspecified foot: Secondary | ICD-10-CM

## 2018-06-22 NOTE — Progress Notes (Signed)
Patient came in today to p/up functional foot orthotics.   The orthotics were assessed to both fit and function.  The F/O addressed the biomechanical issues/pathologies as intended, offering good longitudinal arch support, proper offloading, and foot support. There weren't any signs of discomfort or irritation.  The F/O fit properly in footwear with minimal trimming/adjustments. 

## 2018-08-03 ENCOUNTER — Other Ambulatory Visit: Payer: 59 | Admitting: Orthotics

## 2018-10-20 ENCOUNTER — Ambulatory Visit (INDEPENDENT_AMBULATORY_CARE_PROVIDER_SITE_OTHER): Payer: 59 | Admitting: Family Medicine

## 2018-10-20 VITALS — BP 112/82 | HR 66 | Temp 98.4°F | Resp 16 | Ht 72.0 in | Wt 243.0 lb

## 2018-10-20 DIAGNOSIS — Z1211 Encounter for screening for malignant neoplasm of colon: Secondary | ICD-10-CM | POA: Diagnosis not present

## 2018-10-20 DIAGNOSIS — R0789 Other chest pain: Secondary | ICD-10-CM | POA: Diagnosis not present

## 2018-10-20 DIAGNOSIS — K219 Gastro-esophageal reflux disease without esophagitis: Secondary | ICD-10-CM | POA: Diagnosis not present

## 2018-10-20 DIAGNOSIS — Z0000 Encounter for general adult medical examination without abnormal findings: Secondary | ICD-10-CM

## 2018-10-20 DIAGNOSIS — G4733 Obstructive sleep apnea (adult) (pediatric): Secondary | ICD-10-CM | POA: Diagnosis not present

## 2018-10-20 DIAGNOSIS — Z125 Encounter for screening for malignant neoplasm of prostate: Secondary | ICD-10-CM

## 2018-10-20 MED ORDER — OMEPRAZOLE 40 MG PO CPDR: 40.0000 mg | DELAYED_RELEASE_CAPSULE | Freq: Every day | ORAL | 3 refills | Status: DC

## 2018-10-20 NOTE — Progress Notes (Signed)
Patient: Ralph Patrick, Male    DOB: 1966-07-17, 53 y.o.   MRN: [id number] Visit Date: 10/20/2018  Today's Provider: [name], MD   Chief Complaint  Patient presents with  . Annual Exam   Subjective:  Ralph Patrick is a 53 y.o. male who presents today for health maintenance and complete physical. He feels poorly. He reports exercising none. He reports he is sleeping poorly. Does snore.  No known apnea.  Stance decreased stress over the past year.  Immunization History  Administered Date(s) Administered  . Influenza Whole 06/07/2017   Patient has never had a colonoscopy.  He is interested in doing a cologaurd test.    Review of Systems  Constitutional: Positive for fatigue.  HENT: Negative.   Eyes: Negative.   Respiratory: Positive for chest tightness.   Cardiovascular: Negative.   Gastrointestinal: Negative.   Endocrine: Negative.   Genitourinary: Negative.   Musculoskeletal: Negative.   Skin: Negative.   Allergic/Immunologic: Negative.   Neurological: Negative.   Hematological: Negative.   Psychiatric/Behavioral: Negative.     Social History   Socioeconomic History  . Marital status: Married    Spouse name: Not on file  . Number of children: Not on file  . Years of education: Not on file  . Highest education level: Not on file  Occupational History  . Not on file  Social Needs  . Financial resource strain: Not on file  . Food insecurity:    Worry: Not on file    Inability: Not on file  . Transportation needs:    Medical: Not on file    Non-medical: Not on file  Tobacco Use  . Smoking status: Never Smoker  . Smokeless tobacco: Never Used  Substance and Sexual Activity  . Alcohol use: No    Alcohol/week: 0.0 standard drinks  . Drug use: No  . Sexual activity: Not on file  Lifestyle  . Physical activity:    Days per week: Not on file    Minutes per session: Not on file  . Stress: Not on file  Relationships  . Social connections:    Talks on  phone: Not on file    Gets together: Not on file    Attends religious service: Not on file    Active member of club or organization: Not on file    Attends meetings of clubs or organizations: Not on file    Relationship status: Not on file  . Intimate partner violence:    Fear of current or ex partner: Not on file    Emotionally abused: Not on file    Physically abused: Not on file    Forced sexual activity: Not on file  Other Topics Concern  . Not on file  Social History Narrative  . Not on file    Patient Active Problem List   Diagnosis Date Noted  . GERD (gastroesophageal reflux disease) 10/16/2017    Past Surgical History:  Procedure Laterality Date  . ABSCESS DRAINAGE  09-15-92   perianal  . INCISION AND DRAINAGE  2009, 2010   hemorrhoids  . TONSILLECTOMY  1972  . VASECTOMY      His family history includes CVA in his father; Colon polyps in his mother; Hypothyroidism in his brother and mother; Stroke in his brother.     Outpatient Encounter Medications as of 10/20/2018  Medication Sig  . calcium carbonate (TUMS) 500 MG chewable tablet Chew 1 tablet by mouth daily as needed for indigestion or heartburn.  Marland Kitchen  docusate sodium (COLACE) 100 MG capsule Take 100 mg by mouth daily as needed for mild constipation.  Marland Kitchen ibuprofen (ADVIL,MOTRIN) 100 MG/5ML suspension Take 200 mg by mouth every 4 (four) hours as needed.  . meloxicam (MOBIC) 7.5 MG tablet Take 1 tablet (7.5 mg total) by mouth 2 (two) times daily.   No facility-administered encounter medications on file as of 10/20/2018.     Patient Care Team: Jerrol Banana., MD as PCP - General (Family Medicine) Christene Lye, MD (General Surgery)      Objective:   Vitals:  Vitals:   10/20/18 1409  BP: 112/82  Pulse: 66  Resp: 16  Temp: 98.4 F (36.9 C)  TempSrc: Oral  SpO2: 97%  Weight: 243 lb (110.2 kg)  Height: 6' (1.829 m)    Physical Exam Constitutional:      Appearance: Normal appearance. He is  normal weight.  HENT:     Head: Normocephalic and atraumatic.     Right Ear: Tympanic membrane normal.     Left Ear: Tympanic membrane normal.     Nose: Nose normal.     Mouth/Throat:     Mouth: Mucous membranes are moist.     Pharynx: Oropharynx is clear.  Eyes:     Extraocular Movements: Extraocular movements intact.     Conjunctiva/sclera: Conjunctivae normal.     Pupils: Pupils are equal, round, and reactive to light.  Neck:     Musculoskeletal: Normal range of motion and neck supple.  Cardiovascular:     Rate and Rhythm: Normal rate and regular rhythm.     Pulses: Normal pulses.     Heart sounds: Normal heart sounds.  Pulmonary:     Effort: Pulmonary effort is normal.     Breath sounds: Normal breath sounds.  Abdominal:     General: Abdomen is flat. Bowel sounds are normal.     Palpations: Abdomen is soft.  Genitourinary:    Penis: Normal.      Scrotum/Testes: Normal.     Prostate: Normal.     Rectum: Normal.  Musculoskeletal: Normal range of motion.  Skin:    General: Skin is warm and dry.  Neurological:     General: No focal deficit present.     Mental Status: He is alert and oriented to person, place, and time. Mental status is at baseline.  Psychiatric:        Mood and Affect: Mood normal.        Behavior: Behavior normal.        Thought Content: Thought content normal.        Judgment: Judgment normal.    Fall Risk  10/20/2018  Falls in the past year? 0   Depression Screen PHQ 2/9 Scores 10/20/2018 10/16/2017  PHQ - 2 Score 3 0  PHQ- 9 Score 9 5   Current Exercise Habits: The patient does not participate in regular exercise at present   Functional Status Survey: Is the patient deaf or have difficulty hearing?: No Does the patient have difficulty seeing, even when wearing glasses/contacts?: No Does the patient have difficulty concentrating, remembering, or making decisions?: No Does the patient have difficulty walking or climbing stairs?: No Does the  patient have difficulty dressing or bathing?: No Does the patient have difficulty doing errands alone such as visiting a doctor's office or shopping?: No    Office Visit from 10/20/2018 in [facility]  AUDIT-C Score  0      Assessment & Plan:  Routine Health Maintenance and Physical Exam  Exercise Activities and Dietary recommendations Goals   None     Immunization History  Administered Date(s) Administered  . Influenza Whole 06/07/2017    Health Maintenance  Topic Date Due  . HIV Screening  10/13/1980  . TETANUS/TDAP  10/13/1984  . COLONOSCOPY  10/14/2015  . INFLUENZA VACCINE  05/14/2018     Discussed health benefits of physical activity, and encouraged him to engage in regular exercise appropriate for his age and condition.  1. Annual physical exam  - CBC with Differential/Platelet - Comprehensive metabolic panel - Lipid Panel With LDL/HDL Ratio - TSH  2. Prostate cancer screening  - PSA  3. Colon cancer screening  - Cologuard; Future  4. Chest tightness This appears to be stress related and not exertional.  ECG  in normal limits.  She wishes to follow this presently. - EKG 12-Lead  5. Gastroesophageal reflux disease, esophagitis presence not specified I think some of the chest tightness he is experience with stress is reflux.  Start PPI and return to clinic 2 months. - omeprazole (PRILOSEC) 40 MG capsule; Take 1 capsule (40 mg total) by mouth daily.  Dispense: 30 capsule; Refill: 3  6. OSA (obstructive sleep apnea) I think patient may have sleep apnea.  He declines sleep study.  He wishes to work on weight loss.   I have done the exam and reviewed the chart and it is accurate to the best of my knowledge. Development worker, community has been used and  any errors in dictation or transcription are unintentional. Miguel Aschoff M.D. [facility]

## 2018-10-27 DIAGNOSIS — Z0000 Encounter for general adult medical examination without abnormal findings: Secondary | ICD-10-CM | POA: Diagnosis not present

## 2018-10-27 DIAGNOSIS — Z125 Encounter for screening for malignant neoplasm of prostate: Secondary | ICD-10-CM | POA: Diagnosis not present

## 2018-10-28 LAB — PSA: Prostate Specific Ag, Serum: 0.6 ng/mL (ref 0.0–4.0)

## 2018-10-28 LAB — CBC WITH DIFFERENTIAL/PLATELET
Basophils Absolute: 0.1 10*3/uL (ref 0.0–0.2)
Basos: 1 %
EOS (ABSOLUTE): 0.1 10*3/uL (ref 0.0–0.4)
Eos: 2 %
Hematocrit: 48.1 % (ref 37.5–51.0)
Hemoglobin: 16.9 g/dL (ref 13.0–17.7)
Immature Grans (Abs): 0.1 10*3/uL (ref 0.0–0.1)
Immature Granulocytes: 1 %
Lymphocytes Absolute: 2 10*3/uL (ref 0.7–3.1)
Lymphs: 30 %
MCH: 32.7 pg (ref 26.6–33.0)
MCHC: 35.1 g/dL (ref 31.5–35.7)
MCV: 93 fL (ref 79–97)
Monocytes Absolute: 0.6 10*3/uL (ref 0.1–0.9)
Monocytes: 9 %
Neutrophils Absolute: 3.8 10*3/uL (ref 1.4–7.0)
Neutrophils: 57 %
Platelets: 274 10*3/uL (ref 150–450)
RBC: 5.17 x10E6/uL (ref 4.14–5.80)
RDW: 13 % (ref 11.6–15.4)
WBC: 6.6 10*3/uL (ref 3.4–10.8)

## 2018-10-28 LAB — COMPREHENSIVE METABOLIC PANEL
ALT: 33 IU/L (ref 0–44)
AST: 24 IU/L (ref 0–40)
Albumin/Globulin Ratio: 1.8 (ref 1.2–2.2)
Albumin: 4.6 g/dL (ref 3.5–5.5)
Alkaline Phosphatase: 79 IU/L (ref 39–117)
BUN/Creatinine Ratio: 10 (ref 9–20)
BUN: 11 mg/dL (ref 6–24)
Bilirubin Total: 0.4 mg/dL (ref 0.0–1.2)
CO2: 22 mmol/L (ref 20–29)
Calcium: 9.8 mg/dL (ref 8.7–10.2)
Chloride: 104 mmol/L (ref 96–106)
Creatinine, Ser: 1.07 mg/dL (ref 0.76–1.27)
GFR calc Af Amer: 91 mL/min/{1.73_m2} (ref >59)
GFR calc non Af Amer: 79 mL/min/{1.73_m2} (ref >59)
Globulin, Total: 2.5 g/dL (ref 1.5–4.5)
Glucose: 104 mg/dL — ABNORMAL HIGH (ref 65–99)
Potassium: 4.5 mmol/L (ref 3.5–5.2)
Sodium: 143 mmol/L (ref 134–144)
Total Protein: 7.1 g/dL (ref 6.0–8.5)

## 2018-10-28 LAB — LIPID PANEL WITH LDL/HDL RATIO
Cholesterol, Total: 201 mg/dL — ABNORMAL HIGH (ref 100–199)
HDL: 41 mg/dL (ref >39)
LDL Calculated: 136 mg/dL — ABNORMAL HIGH (ref 0–99)
LDl/HDL Ratio: 3.3 ratio (ref 0.0–3.6)
Triglycerides: 118 mg/dL (ref 0–149)
VLDL Cholesterol Cal: 24 mg/dL (ref 5–40)

## 2018-10-28 LAB — TSH: TSH: 4.6 u[IU]/mL — ABNORMAL HIGH (ref 0.450–4.500)

## 2018-10-29 ENCOUNTER — Encounter: Payer: Self-pay | Admitting: Podiatry

## 2018-10-29 ENCOUNTER — Ambulatory Visit (INDEPENDENT_AMBULATORY_CARE_PROVIDER_SITE_OTHER): Payer: 59 | Admitting: Podiatry

## 2018-10-29 DIAGNOSIS — M779 Enthesopathy, unspecified: Secondary | ICD-10-CM

## 2018-10-29 DIAGNOSIS — M79672 Pain in left foot: Secondary | ICD-10-CM | POA: Diagnosis not present

## 2018-10-29 DIAGNOSIS — M2020 Hallux rigidus, unspecified foot: Secondary | ICD-10-CM | POA: Diagnosis not present

## 2018-10-29 DIAGNOSIS — M205X9 Other deformities of toe(s) (acquired), unspecified foot: Secondary | ICD-10-CM

## 2018-10-29 NOTE — Progress Notes (Signed)
This patient returns to the office for continued evaluation of his left foot.  He was diagnosed as having a capsulitis sub-2 left foot with a functional hallux limitus first MPJ left foot.  He was initially treated with orthoses Mobic and even injection therapy due to the capsulitis sub-2.  He returns to the office today 5 weeks since he initially presented to the office.  He states that the pain is just as bad and that the orthoses have been ineffective and he has discontinued usage.  The Mobic has not been helpful.  The only thing which has helped is a previous injection for capsulitis second MPJ left foot.  He presents the office today for continued evaluation and treatment of his painful left foot.    General Appearance  Alert, conversant and in no acute stress.  Vascular  Dorsalis pedis and posterior tibial  pulses are palpable  bilaterally.  Capillary return is within normal limits  bilaterally. Temperature is within normal limits  bilaterally.  Neurologic  Senn-Weinstein monofilament wire test within normal limits  bilaterally. Muscle power within normal limits bilaterally.  Nails normal nails noted with no evidence of bacterial or fungal infection.  Orthopedic  No limitations of motion of  feet .  No crepitus or effusions noted.  No bony pathology or digital deformities noted .Functional hallux limitus first MPJ bilaterally.palpable pain, sub-2, left foot.  Skin  normotropic skin with no porokeratosis noted bilaterally.  No signs of infections or ulcers noted.    Capsulitis sub 2 left.  FHL  B/L.  ROV.  Discussed this condition with this patient.  He says that he is in worse pain now that he ever has been.  I examined his orthoses and was not pleased with the forefoot modifications.  I asked the patient if I was able to have his orthoses and bring them  to the office to have them worked on by [employer].  He says since he never thought they helped he has not really ever worn them.  Therefore I  proceeded to provide him with an injection for his capsulitis and take the orthoses back to be evaluated.  Once this is done I will evaluate his progress and if there is no progress I will  recommend a surgical consult by Dr. Milinda Pointer or Dr. Amalia Hailey.   Gardiner Barefoot DPM

## 2018-11-03 ENCOUNTER — Telehealth: Payer: Self-pay | Admitting: *Deleted

## 2018-11-03 NOTE — Telephone Encounter (Signed)
Called patient to ask him per Dr Prudence Davidson to come into the [facility] office  on 11/04/2018 to see Rick,patient  stated he is unable to make that date. Gave name to Estill Bamberg to schedule another appointment time w/Rick , possible 11/05/2018 [facility] office.

## 2018-11-05 ENCOUNTER — Ambulatory Visit (INDEPENDENT_AMBULATORY_CARE_PROVIDER_SITE_OTHER): Payer: 59 | Admitting: Podiatry

## 2018-11-05 ENCOUNTER — Encounter: Payer: Self-pay | Admitting: Podiatry

## 2018-11-05 DIAGNOSIS — M205X9 Other deformities of toe(s) (acquired), unspecified foot: Secondary | ICD-10-CM

## 2018-11-05 DIAGNOSIS — M2020 Hallux rigidus, unspecified foot: Secondary | ICD-10-CM | POA: Diagnosis not present

## 2018-11-05 DIAGNOSIS — M779 Enthesopathy, unspecified: Secondary | ICD-10-CM

## 2018-11-05 DIAGNOSIS — M79672 Pain in left foot: Secondary | ICD-10-CM

## 2018-11-05 NOTE — Progress Notes (Signed)
This patient returns to the office 1 week following diagnosis of an acute capsulitis second MPJ left foot with functional hallux limitus left foot.  He was treated with injection therapy in the second MPJ.  He said that he had extreme pain that evening following the shot but the next day the pain improved and now he has he has 80% improvement from last week.  He says he is now able to walk in regular shoes as well as touch the area that was so painful last week.  He also presents the office and the re-modified orthoses from Liliane Channel was obtained  Vascular  Dorsalis pedis and posterior tibial pulses are palpable  B/L.  Capillary return  WNL.  Temperature gradient is  WNL.  Skin turgor  WNL  Sensorium  Senn Weinstein monofilament wire  WNL. Normal tactile sensation.  Nail Exam  Patient has normal nails with no evidence of bacterial or fungal infection.  Orthopedic  Exam  Muscle tone and muscle strength  WNL.  No limitations of motion feet  B/L.  No crepitus or joint effusion noted.  Foot type is unremarkable and digits show no abnormalities.  Functional hallux limitus 1st MPJ left .  Minimal palpable pain with no swelling sub 2 left.  Skin  No open lesions.  Normal skin texture and turgor.  Capsulitis sub 2 left resolving.  FHL  B/L  ROV discussed this condition with this patient.  Dispensed the newly modified orthoses made by [facility].  Patient states they feel good to his feet.  Patient states his foot has come far in 1 week and is pleased upon leaving the office.  I did discuss if he has another acute flareup we need to seriously consider surgical correction and possible exploration of his second metatarsal left foot.   Gardiner Barefoot DPM

## 2018-11-16 ENCOUNTER — Telehealth: Payer: 59 | Admitting: Physician Assistant

## 2018-11-16 ENCOUNTER — Encounter: Payer: Self-pay | Admitting: Physician Assistant

## 2018-11-16 DIAGNOSIS — H9209 Otalgia, unspecified ear: Secondary | ICD-10-CM | POA: Diagnosis not present

## 2018-11-16 DIAGNOSIS — J3089 Other allergic rhinitis: Secondary | ICD-10-CM

## 2018-11-16 MED ORDER — BENZONATATE 200 MG PO CAPS: 200.0000 mg | ORAL_CAPSULE | Freq: Three times a day (TID) | ORAL | 0 refills | Status: DC | PRN

## 2018-11-16 MED ORDER — FLUTICASONE PROPIONATE 50 MCG/ACT NA SUSP: 2.0000 | Freq: Every day | NASAL | 0 refills | Status: DC

## 2018-11-16 MED ORDER — CETIRIZINE HCL 10 MG PO TABS: 10.0000 mg | ORAL_TABLET | Freq: Every day | ORAL | 0 refills | Status: DC

## 2018-11-16 NOTE — Progress Notes (Addendum)
We are sorry that you are not feeling well.  Here is how we plan to help!  Based on your presentation I believe you most likely have A cough due to allergies.  I recommend that you start the an over-the counter-allergy medication such as Claritin 10 mg or Zyrtec 10 mg daily.     In addition you may use A prescription cough medication called Tessalon Perles 100mg . You may take 1-2 capsules every 8 hours as needed for your cough.  I will also prescribe a nasal spray called Flonase, its steroid base, 2 sprays in each nostril once daily. This will greatly help with your sneezing and runny nose.  From your responses in the eVisit questionnaire you describe inflammation in the upper respiratory tract which is causing a significant cough.  This is commonly called Bronchitis and has four common causes:    Allergies  Viral Infections  Acid Reflux  Bacterial Infection Allergies, viruses and acid reflux are treated by controlling symptoms or eliminating the cause. An example might be a cough caused by taking certain blood pressure medications. You stop the cough by changing the medication. Another example might be a cough caused by acid reflux. Controlling the reflux helps control the cough.  USE OF BRONCHODILATOR ("RESCUE") INHALERS: There is a risk from using your bronchodilator too frequently.  The risk is that over-reliance on a medication which only relaxes the muscles surrounding the breathing tubes can reduce the effectiveness of medications prescribed to reduce swelling and congestion of the tubes themselves.  Although you feel brief relief from the bronchodilator inhaler, your asthma may actually be worsening with the tubes becoming more swollen and filled with mucus.  This can delay other crucial treatments, such as oral steroid medications. If you need to use a bronchodilator inhaler daily, several times per day, you should discuss this with your provider.  There are probably better treatments  that could be used to keep your asthma under control.     HOME CARE . Only take medications as instructed by your medical team. . Complete the entire course of an antibiotic. . Drink plenty of fluids and get plenty of rest. . Avoid close contacts especially the very young and the elderly . Cover your mouth if you cough or cough into your sleeve. . Always remember to wash your hands . A steam or ultrasonic humidifier can help congestion.   GET HELP RIGHT AWAY IF: . You develop worsening fever. . You become short of breath . You cough up blood. . Your symptoms persist after you have completed your treatment plan MAKE SURE YOU   Understand these instructions.  Will watch your condition.  Will get help right away if you are not doing well or get worse.  Your e-visit answers were reviewed by a board certified advanced clinical practitioner to complete your personal care plan.  Depending on the condition, your plan could have included both over the counter or prescription medications. If there is a problem please reply  once you have received a response from your provider. Your safety is important to [place].  If you have drug allergies check your prescription carefully.    You can use MyChart to ask questions about today's visit, request a non-urgent call back, or ask for a work or school excuse for 24 hours related to this e-Visit. If it has been greater than 24 hours you will need to follow up with your provider, or enter a new e-Visit to address those concerns.  You will get an e-mail in the next two days asking about your experience.  I hope that your e-visit has been valuable and will speed your recovery. Thank you for using e-visits.  I have spent 7 minutes in review of this chart- Lacy Duverney [facility]

## 2018-11-19 ENCOUNTER — Encounter: Payer: Self-pay | Admitting: Family Medicine

## 2018-11-19 ENCOUNTER — Ambulatory Visit (INDEPENDENT_AMBULATORY_CARE_PROVIDER_SITE_OTHER): Payer: 59 | Admitting: Family Medicine

## 2018-11-19 VITALS — BP 122/70 | HR 64 | Temp 98.5°F | Resp 16 | Wt 240.0 lb

## 2018-11-19 DIAGNOSIS — Z6832 Body mass index (BMI) 32.0-32.9, adult: Secondary | ICD-10-CM

## 2018-11-19 DIAGNOSIS — E6609 Other obesity due to excess calories: Secondary | ICD-10-CM | POA: Diagnosis not present

## 2018-11-19 DIAGNOSIS — K219 Gastro-esophageal reflux disease without esophagitis: Secondary | ICD-10-CM | POA: Diagnosis not present

## 2018-11-19 DIAGNOSIS — J45901 Unspecified asthma with (acute) exacerbation: Secondary | ICD-10-CM | POA: Diagnosis not present

## 2018-11-19 MED ORDER — ALBUTEROL SULFATE HFA 108 (90 BASE) MCG/ACT IN AERS: 2.0000 | INHALATION_SPRAY | Freq: Four times a day (QID) | RESPIRATORY_TRACT | 2 refills | Status: DC | PRN

## 2018-11-19 MED ORDER — OMEPRAZOLE 40 MG PO CPDR: 40.0000 mg | DELAYED_RELEASE_CAPSULE | Freq: Every day | ORAL | 3 refills | Status: AC

## 2018-11-19 MED ORDER — AZITHROMYCIN 250 MG PO TABS: ORAL_TABLET | ORAL | 0 refills | Status: DC

## 2018-11-19 NOTE — Progress Notes (Signed)
Patient: Ralph Patrick Male    DOB: [birth year]   53 y.o.   MRN: [id number] Visit Date: 11/19/2018  Today's Provider: [name], MD   Chief Complaint  Patient presents with  . Follow-up  . URI   Subjective:     HPI  Patient comes in today for a follow up. He was last seen in the office 1 month ago. At the time of visit, he was c/o chest tightness which could possibly be stress related. He was also advised to start omeprazole 40mg  daily to cover possible underlying GERD symptoms. Patient reports that since starting the medication, his symptoms have resolved completely.   He also mentions that he has URI symptoms. He has been taking Flonase and Claritin 10mg  with no relief.  He feels a mild cough and occasional wheezing. Going  On for a few weeks. No Known Allergies   Current Outpatient Medications:  .  benzonatate (TESSALON) 200 MG capsule, Take 1 capsule (200 mg total) by mouth 3 (three) times daily as needed for cough., Disp: 20 capsule, Rfl: 0 .  calcium carbonate (TUMS) 500 MG chewable tablet, Chew 1 tablet by mouth daily as needed for indigestion or heartburn., Disp: , Rfl:  .  cetirizine (ZYRTEC) 10 MG tablet, Take 1 tablet (10 mg total) by mouth daily., Disp: 30 tablet, Rfl: 0 .  docusate sodium (COLACE) 100 MG capsule, Take 100 mg by mouth daily as needed for mild constipation., Disp: , Rfl:  .  fluticasone (FLONASE) 50 MCG/ACT nasal spray, Place 2 sprays into both nostrils daily., Disp: 16 g, Rfl: 0 .  ibuprofen (ADVIL,MOTRIN) 100 MG/5ML suspension, Take 200 mg by mouth every 4 (four) hours as needed., Disp: , Rfl:  .  meloxicam (MOBIC) 7.5 MG tablet, Take 1 tablet (7.5 mg total) by mouth 2 (two) times daily., Disp: 60 tablet, Rfl: 1 .  omeprazole (PRILOSEC) 40 MG capsule, Take 1 capsule (40 mg total) by mouth daily., Disp: 30 capsule, Rfl: 3  Review of Systems  Constitutional: Negative for activity change, appetite change, chills, diaphoresis and fatigue.    HENT: Positive for congestion, postnasal drip, rhinorrhea, sneezing and voice change.   Eyes: Negative.   Respiratory: Positive for cough.   Cardiovascular: Negative for chest pain, palpitations and leg swelling.  Endocrine: Negative.   Allergic/Immunologic: Negative.   Neurological: Negative for dizziness, light-headedness and headaches.  Psychiatric/Behavioral: Negative.     Social History   Tobacco Use  . Smoking status: Never Smoker  . Smokeless tobacco: Never Used  Substance Use Topics  . Alcohol use: No    Alcohol/week: 0.0 standard drinks      Objective:   BP 122/70 (BP Location: Left Arm, Patient Position: Sitting, Cuff Size: Large)   Pulse 64   Temp 98.5 F (36.9 C)   Resp 16   Wt 240 lb (108.9 kg)   SpO2 98%   BMI 32.55 kg/m  Vitals:   11/19/18 1553  BP: 122/70  Pulse: 64  Resp: 16  Temp: 98.5 F (36.9 C)  SpO2: 98%  Weight: 240 lb (108.9 kg)     Physical Exam Constitutional:      Appearance: Normal appearance. He is normal weight.  HENT:     Head: Normocephalic and atraumatic.     Right Ear: Tympanic membrane normal.     Left Ear: Tympanic membrane normal.     Nose: Nose normal.     Mouth/Throat:     Mouth:  Mucous membranes are moist.     Pharynx: Oropharynx is clear.  Eyes:     Extraocular Movements: Extraocular movements intact.     Conjunctiva/sclera: Conjunctivae normal.     Pupils: Pupils are equal, round, and reactive to light.  Neck:     Musculoskeletal: Normal range of motion and neck supple.  Cardiovascular:     Rate and Rhythm: Normal rate and regular rhythm.     Pulses: Normal pulses.     Heart sounds: Normal heart sounds.  Pulmonary:     Effort: Pulmonary effort is normal.     Breath sounds: Wheezing present.     Comments: Very mild expiratory wheezes. Abdominal:     General: Abdomen is flat. Bowel sounds are normal.     Palpations: Abdomen is soft.  Musculoskeletal: Normal range of motion.  Skin:    General: Skin is  warm and dry.  Neurological:     General: No focal deficit present.     Mental Status: He is alert and oriented to person, place, and time. Mental status is at baseline.  Psychiatric:        Mood and Affect: Mood normal.        Behavior: Behavior normal.        Thought Content: Thought content normal.        Judgment: Judgment normal.         Assessment & Plan    1. Gastroesophageal reflux disease, esophagitis presence not specified Improved and controlled.  Continue Prilosec daily until next visit. - omeprazole (PRILOSEC) 40 MG capsule; Take 1 capsule (40 mg total) by mouth daily.  Dispense: 90 capsule; Refill: 3  2. Asthmatic bronchitis with acute exacerbation, unspecified asthma severity, unspecified whether persistent Next given with some relief.  Today we will prescribe albuterol MDI every 4 hours as needed and a Z-Pak since is been going on for several weeks.  Patient it looks fairly well today if so we will hold off on any chest x-ray or lab work. - albuterol (PROVENTIL HFA;VENTOLIN HFA) 108 (90 Base) MCG/ACT inhaler; Inhale 2 puffs into the lungs every 6 (six) hours as needed for wheezing or shortness of breath.  Dispense: 1 Inhaler; Refill: 2 - azithromycin (ZITHROMAX) 250 MG tablet; Take 2 tablets by mouth today, then 1 tablet by mouth daily.  Dispense: 6 tablet; Refill: 0 3.Obesity   I have done the exam and reviewed the above chart and it is accurate to the best of my knowledge. Development worker, community has been used in this note in any air is in the dictation or transcription are unintentional.  [name], MD  [place]

## 2019-05-20 ENCOUNTER — Ambulatory Visit (INDEPENDENT_AMBULATORY_CARE_PROVIDER_SITE_OTHER): Payer: 59 | Admitting: Family Medicine

## 2019-05-20 ENCOUNTER — Other Ambulatory Visit: Payer: Self-pay

## 2019-05-20 ENCOUNTER — Encounter: Payer: Self-pay | Admitting: Family Medicine

## 2019-05-20 VITALS — BP 104/70 | HR 72 | Temp 97.9°F | Resp 16 | Wt 251.4 lb

## 2019-05-20 DIAGNOSIS — K219 Gastro-esophageal reflux disease without esophagitis: Secondary | ICD-10-CM

## 2019-05-20 DIAGNOSIS — Z6834 Body mass index (BMI) 34.0-34.9, adult: Secondary | ICD-10-CM | POA: Diagnosis not present

## 2019-05-20 DIAGNOSIS — E6609 Other obesity due to excess calories: Secondary | ICD-10-CM | POA: Diagnosis not present

## 2019-05-20 NOTE — Progress Notes (Signed)
Patient: Ralph Patrick Male    DOB: 26-Jul-1966   53 y.o.   MRN: [id number] Visit Date: 05/20/2019  Today's Provider: [name], MD   Chief Complaint  Patient presents with  . Gastroesophageal Reflux   Subjective:     HPI  GERD, Follow up:  The patient was last seen for GERD 6 months ago. Changes made since that visit include none patient was advised to continue Prilosec 40mg .  He reports excellent compliance with treatment. He is not having side effects.   He is NOT experiencing abdominal bloating, belching, belching and eructation, bilious reflux, chest pain, choking on food, cough, deep pressure at base of neck, difficulty swallowing, dysphagia, early satiety, fullness after meals, heartburn, hematemesis, hoarseness, laryngitis, melena, midespigastric pain, nausea, need to clear throat frequently, nocturnal burning, odynophagia, regurgitation of undigested food, shortness of breath, symptoms primarily relate to meals, and lying down after meals, unexpected weight loss, upper abdominal discomfort, waterbrash or wheezing  ------------------------------------------------------------------------ He is having trouble with losing weight.  No Known Allergies   Current Outpatient Medications:  .  omeprazole (PRILOSEC) 40 MG capsule, Take 1 capsule (40 mg total) by mouth daily., Disp: 90 capsule, Rfl: 3 .  albuterol (PROVENTIL HFA;VENTOLIN HFA) 108 (90 Base) MCG/ACT inhaler, Inhale 2 puffs into the lungs every 6 (six) hours as needed for wheezing or shortness of breath. (Patient not taking: Reported on 05/20/2019), Disp: 1 Inhaler, Rfl: 2 .  calcium carbonate (TUMS) 500 MG chewable tablet, Chew 1 tablet by mouth daily as needed for indigestion or heartburn., Disp: , Rfl:  .  cetirizine (ZYRTEC) 10 MG tablet, Take 1 tablet (10 mg total) by mouth daily. (Patient not taking: Reported on 05/20/2019), Disp: 30 tablet, Rfl: 0 .  docusate sodium (COLACE) 100 MG capsule, Take 100 mg  by mouth daily as needed for mild constipation., Disp: , Rfl:  .  fluticasone (FLONASE) 50 MCG/ACT nasal spray, Place 2 sprays into both nostrils daily. (Patient not taking: Reported on 05/20/2019), Disp: 16 g, Rfl: 0 .  ibuprofen (ADVIL,MOTRIN) 100 MG/5ML suspension, Take 200 mg by mouth every 4 (four) hours as needed., Disp: , Rfl:  .  meloxicam (MOBIC) 7.5 MG tablet, Take 1 tablet (7.5 mg total) by mouth 2 (two) times daily. (Patient not taking: Reported on 05/20/2019), Disp: 60 tablet, Rfl: 1  Review of Systems  Constitutional: Negative for activity change, appetite change, chills, diaphoresis and fatigue.  HENT: Positive for congestion, postnasal drip, rhinorrhea, sneezing and voice change.   Eyes: Negative.   Respiratory: Negative.   Cardiovascular: Negative for chest pain, palpitations and leg swelling.  Endocrine: Negative.   Allergic/Immunologic: Negative.   Neurological: Negative for dizziness, light-headedness and headaches.  Psychiatric/Behavioral: Negative.     Social History   Tobacco Use  . Smoking status: Never Smoker  . Smokeless tobacco: Never Used  Substance Use Topics  . Alcohol use: No    Alcohol/week: 0.0 standard drinks      Objective:   BP 104/70   Pulse 72   Temp 97.9 F (36.6 C) (Oral)   Resp 16   Wt 251 lb 6.4 oz (114 kg)   BMI 34.10 kg/m  Vitals:   05/20/19 1611  BP: 104/70  Pulse: 72  Resp: 16  Temp: 97.9 F (36.6 C)  TempSrc: Oral  Weight: 251 lb 6.4 oz (114 kg)     Physical Exam Vitals signs reviewed.  Constitutional:      Appearance: Normal appearance.  He is normal weight.  HENT:     Head: Normocephalic and atraumatic.     Right Ear: Tympanic membrane normal.     Left Ear: Tympanic membrane normal.     Nose: Nose normal.     Mouth/Throat:     Mouth: Mucous membranes are moist.     Pharynx: Oropharynx is clear.  Eyes:     Extraocular Movements: Extraocular movements intact.     Conjunctiva/sclera: Conjunctivae normal.      Pupils: Pupils are equal, round, and reactive to light.  Neck:     Musculoskeletal: Normal range of motion and neck supple.  Cardiovascular:     Rate and Rhythm: Normal rate and regular rhythm.     Pulses: Normal pulses.     Heart sounds: Normal heart sounds.  Pulmonary:     Effort: Pulmonary effort is normal.     Breath sounds: Wheezing present.     Comments: Very mild expiratory wheezes. Abdominal:     General: Abdomen is flat. Bowel sounds are normal.     Palpations: Abdomen is soft.  Musculoskeletal: Normal range of motion.  Skin:    General: Skin is warm and dry.  Neurological:     General: No focal deficit present.     Mental Status: He is alert and oriented to person, place, and time. Mental status is at baseline.  Psychiatric:        Mood and Affect: Mood normal.        Behavior: Behavior normal.        Thought Content: Thought content normal.        Judgment: Judgment normal.      No results found for any visits on 05/20/19.     Assessment & Plan    1. Class 1 obesity due to excess calories without serious comorbidity with body mass index (BMI) of 34.0 to 34.9 in adult Try Phenteramine q am for 3 months.  2. Gastroesophageal reflux disease, esophagitis presence not specified      [name], MD  [employer] Fritzi Mandes [place] as a scribe for [name], MD.,have documented all relevant documentation on the behalf of [name], MD,as directed by  [name], MD while in the presence of [name], MD.

## 2019-05-21 ENCOUNTER — Telehealth: Payer: Self-pay | Admitting: Family Medicine

## 2019-05-21 DIAGNOSIS — E6609 Other obesity due to excess calories: Secondary | ICD-10-CM

## 2019-05-21 MED ORDER — PHENTERMINE HCL 37.5 MG PO TABS: 37.5000 mg | ORAL_TABLET | Freq: Every day | ORAL | 3 refills | Status: DC

## 2019-05-21 NOTE — Telephone Encounter (Signed)
Does he know the name? Dr. Darnell Level has not completed his note so I am not sure which one.

## 2019-05-21 NOTE — Telephone Encounter (Signed)
Sent!

## 2019-05-21 NOTE — Telephone Encounter (Signed)
Phentermine 37.5 #30 R3

## 2019-05-21 NOTE — Telephone Encounter (Signed)
Pt went to pick up weight loss medication he was supposed to start.  It wasn't called into:  [place], [place] [phone] (Phone) [phone] (Fax)   Pt needing the Rx to be filled.  Please call pt back to let him know if it has been done.  Thanks, [employer]

## 2019-05-23 DIAGNOSIS — E669 Obesity, unspecified: Secondary | ICD-10-CM | POA: Insufficient documentation

## 2019-05-23 MED ORDER — PHENTERMINE HCL 37.5 MG PO CAPS: 37.5000 mg | ORAL_CAPSULE | ORAL | 5 refills | Status: DC

## 2019-08-06 NOTE — Progress Notes (Signed)
Patient: Ralph Patrick Male    DOB: [birth year]   53 y.o.   MRN: [id number] Visit Date: 08/09/2019  Today's Provider: [name], MD   Chief Complaint  Patient presents with  . Follow-up   Subjective:   HPI    Class 1 obesity due to excess calories without serious comorbidity with body mass index (BMI) of 34.0 to 34.9 in adult From 05/20/2019- Try Phenteramine q am for 3 months.  Gastroesophageal reflux disease, esophagitis presence not specified From 05/20/2019- No changes.   No Known Allergies   Current Outpatient Medications:  .  calcium carbonate (TUMS) 500 MG chewable tablet, Chew 1 tablet by mouth daily as needed for indigestion or heartburn., Disp: , Rfl:  .  docusate sodium (COLACE) 100 MG capsule, Take 100 mg by mouth daily as needed for mild constipation., Disp: , Rfl:  .  ibuprofen (ADVIL,MOTRIN) 100 MG/5ML suspension, Take 200 mg by mouth every 4 (four) hours as needed., Disp: , Rfl:  .  omeprazole (PRILOSEC) 40 MG capsule, Take 1 capsule (40 mg total) by mouth daily., Disp: 90 capsule, Rfl: 3 .  phentermine (ADIPEX-P) 37.5 MG tablet, Take 1 tablet (37.5 mg total) by mouth daily before breakfast., Disp: 30 tablet, Rfl: 3 .  albuterol (PROVENTIL HFA;VENTOLIN HFA) 108 (90 Base) MCG/ACT inhaler, Inhale 2 puffs into the lungs every 6 (six) hours as needed for wheezing or shortness of breath. (Patient not taking: Reported on 05/20/2019), Disp: 1 Inhaler, Rfl: 2 .  cetirizine (ZYRTEC) 10 MG tablet, Take 1 tablet (10 mg total) by mouth daily. (Patient not taking: Reported on 05/20/2019), Disp: 30 tablet, Rfl: 0 .  fluticasone (FLONASE) 50 MCG/ACT nasal spray, Place 2 sprays into both nostrils daily. (Patient not taking: Reported on 05/20/2019), Disp: 16 g, Rfl: 0 .  meloxicam (MOBIC) 7.5 MG tablet, Take 1 tablet (7.5 mg total) by mouth 2 (two) times daily. (Patient not taking: Reported on 05/20/2019), Disp: 60 tablet, Rfl: 1 .  phentermine 37.5 MG capsule, Take 1 capsule  (37.5 mg total) by mouth every morning. (Patient not taking: Reported on 08/09/2019), Disp: 30 capsule, Rfl: 5  Review of Systems  Constitutional: Negative for activity change and fatigue.  Eyes: Negative.   Respiratory: Negative for cough and shortness of breath.   Cardiovascular: Negative for chest pain, palpitations and leg swelling.  Gastrointestinal: Negative for abdominal distention, abdominal pain, blood in stool, constipation, diarrhea, nausea, rectal pain and vomiting.  Endocrine: Negative.   Allergic/Immunologic: Negative.   Neurological: Negative for dizziness and headaches.  Psychiatric/Behavioral: Negative.     Social History   Tobacco Use  . Smoking status: Never Smoker  . Smokeless tobacco: Never Used  Substance Use Topics  . Alcohol use: No    Alcohol/week: 0.0 standard drinks      Objective:   BP 101/65 (BP Location: Right Arm, Patient Position: Sitting, Cuff Size: Large)   Pulse 74   Temp (!) 96.9 F (36.1 C) (Other (Comment))   Resp 16   Ht 6' (1.829 m)   Wt 226 lb (102.5 kg)   SpO2 96%   BMI 30.65 kg/m  Vitals:   08/09/19 1552  BP: 101/65  Pulse: 74  Resp: 16  Temp: (!) 96.9 F (36.1 C)  TempSrc: Other (Comment)  SpO2: 96%  Weight: 226 lb (102.5 kg)  Height: 6' (1.829 m)  Body mass index is 30.65 kg/m.   Physical Exam Vitals signs reviewed.  Constitutional:  Appearance: Normal appearance. He is normal weight.  HENT:     Head: Normocephalic and atraumatic.     Right Ear: Tympanic membrane normal.     Left Ear: Tympanic membrane normal.     Nose: Nose normal.     Mouth/Throat:     Mouth: Mucous membranes are moist.     Pharynx: Oropharynx is clear.  Eyes:     Extraocular Movements: Extraocular movements intact.     Conjunctiva/sclera: Conjunctivae normal.     Pupils: Pupils are equal, round, and reactive to light.  Neck:     Musculoskeletal: Normal range of motion and neck supple.  Cardiovascular:     Rate and Rhythm: Normal  rate and regular rhythm.     Pulses: Normal pulses.     Heart sounds: Normal heart sounds.  Pulmonary:     Effort: Pulmonary effort is normal.     Breath sounds: Normal breath sounds.  Abdominal:     Palpations: Abdomen is soft.  Musculoskeletal: Normal range of motion.  Skin:    General: Skin is warm and dry.  Neurological:     General: No focal deficit present.     Mental Status: He is alert and oriented to person, place, and time. Mental status is at baseline.  Psychiatric:        Mood and Affect: Mood normal.        Behavior: Behavior normal.        Thought Content: Thought content normal.        Judgment: Judgment normal.      No results found for any visits on 08/09/19.     Assessment & Plan     1. Class 1 obesity due to excess calories with serious comorbidity and body mass index (BMI) of 34.0 to 34.9 in adult Patient has done well losing 25 pounds since his last visit.  Continue to work on diet and exercise - phentermine (ADIPEX-P) 37.5 MG tablet; Take 1 tablet (37.5 mg total) by mouth daily before breakfast.  Dispense: 30 tablet; Refill: 3  Follow up in January for CPE.      Richard Cranford Mon, MD  [facility]

## 2019-08-09 ENCOUNTER — Ambulatory Visit (INDEPENDENT_AMBULATORY_CARE_PROVIDER_SITE_OTHER): Payer: 59 | Admitting: Family Medicine

## 2019-08-09 ENCOUNTER — Other Ambulatory Visit: Payer: Self-pay

## 2019-08-09 ENCOUNTER — Encounter: Payer: Self-pay | Admitting: Family Medicine

## 2019-08-09 VITALS — BP 101/65 | HR 74 | Temp 96.9°F | Resp 16 | Ht 72.0 in | Wt 226.0 lb

## 2019-08-09 DIAGNOSIS — Z6834 Body mass index (BMI) 34.0-34.9, adult: Secondary | ICD-10-CM | POA: Diagnosis not present

## 2019-08-09 DIAGNOSIS — E6609 Other obesity due to excess calories: Secondary | ICD-10-CM | POA: Diagnosis not present

## 2019-08-09 MED ORDER — PHENTERMINE HCL 37.5 MG PO TABS: 37.5000 mg | ORAL_TABLET | Freq: Every day | ORAL | 3 refills | Status: DC

## 2019-09-17 ENCOUNTER — Other Ambulatory Visit: Payer: Self-pay

## 2019-09-17 DIAGNOSIS — Z20822 Contact with and (suspected) exposure to covid-19: Secondary | ICD-10-CM

## 2019-09-19 LAB — NOVEL CORONAVIRUS, NAA: SARS-CoV-2, NAA: NOT DETECTED

## 2019-10-06 ENCOUNTER — Ambulatory Visit: Payer: 59 | Attending: Internal Medicine

## 2019-10-06 DIAGNOSIS — Z20822 Contact with and (suspected) exposure to covid-19: Secondary | ICD-10-CM

## 2019-10-06 DIAGNOSIS — Z20828 Contact with and (suspected) exposure to other viral communicable diseases: Secondary | ICD-10-CM | POA: Diagnosis not present

## 2019-10-08 LAB — NOVEL CORONAVIRUS, NAA: SARS-CoV-2, NAA: DETECTED — AB

## 2019-10-11 ENCOUNTER — Ambulatory Visit: Payer: Self-pay | Admitting: *Deleted

## 2019-10-11 NOTE — Telephone Encounter (Signed)
Message from Luciana Axe sent at 10/11/2019 5:25 PM EST  Patient is calling to receive his COVID test result. Patient received results in Mychart "Not detected" on Wednesday. Then received MyChart results today that the patient is positive. Patient is needing clarity. And would like to know can the patient go to work tomorrow.    Patient called and states that his test result seen via MyChart was "Not detected" and he was confused. Patient notified that his test was positive which mean that he was infected with the novel coronavirus. Patient states that he was initially tested due to his grandson coming over during Christmas. Pt states that he had symptoms of a head cold around Monday of last week but not really sure. Pt does not have symptoms currently. Pt advised that he would need to isolate at least 10 days from the date of positive test And 3 consecutive days fever free without the use of fever reducing medications And improvement in respiratory symptoms. Pt advised that any family members that were around him this weekend and last week would need to be notified and will need to quarantine at home for 14 days from the last day that they were exposed to the patient. Patient advised that family members may also need to be tested and should notify their PCP.Pt advised that he would also need to notify [facility] [employer]. Pt went to work today since he went to work today thinking that his result was negative. Home care advice given. Pt verbalized understanding. [facility] [facility] [employer] [facility] notified.  Reason for Disposition . [1] CLOSE CONTACT COVID-19 EXPOSURE within last 14 days AND [2] NO symptoms  Protocols used: CORONAVIRUS (COVID-19) EXPOSURE-A-AH

## 2019-10-12 NOTE — Telephone Encounter (Signed)
It looks like he had a positive test 6 days ago.  We did not order this I am not sure if he was symptomatic or if this was screening.

## 2019-10-12 NOTE — Telephone Encounter (Signed)
FYI  For clarity, what he may have been seeing that said not detected; he did have a negative test on 09/17/19

## 2019-10-25 ENCOUNTER — Encounter: Payer: Self-pay | Admitting: Family Medicine

## 2019-11-29 NOTE — Progress Notes (Signed)
Patient: Ralph Patrick, Male    DOB: [birth year], 54 y.o.   MRN: [id number] Visit Date: 11/30/2019  Today's Provider: [name], MD   Chief Complaint  Patient presents with  . Annual Exam   Subjective:     Annual physical exam Ralph Patrick is a 54 y.o. male who presents today for health maintenance and complete physical. He feels well. He reports exercising yes. He reports he is sleeping well.  -----------------------------------------------------------  Cologuard ordered 10/20/2018-but zero done.  Review of Systems  Constitutional: Positive for fatigue.  HENT: Negative.   Eyes: Negative.   Respiratory: Positive for chest tightness.   Cardiovascular: Negative.   Gastrointestinal: Negative.   Endocrine: Negative.   Genitourinary: Negative.   Musculoskeletal: Negative.   Skin: Negative.   Allergic/Immunologic: Negative.   Neurological: Negative.   Hematological: Negative.   Psychiatric/Behavioral: Negative.     Social History      He  reports that he has never smoked. He has never used smokeless tobacco. He reports that he does not drink alcohol or use drugs.       Social History   Socioeconomic History  . Marital status: Married    Spouse name: Not on file  . Number of children: Not on file  . Years of education: Not on file  . Highest education level: Not on file  Occupational History  . Not on file  Tobacco Use  . Smoking status: Never Smoker  . Smokeless tobacco: Never Used  Substance and Sexual Activity  . Alcohol use: No    Alcohol/week: 0.0 standard drinks  . Drug use: No  . Sexual activity: Not on file  Other Topics Concern  . Not on file  Social History Narrative  . Not on file   Social Determinants of Health   Financial Resource Strain:   . Difficulty of Paying Living Expenses: Not on file  Food Insecurity:   . Worried About Charity fundraiser in the Last Year: Not on file  . Ran Out of Food in the Last Year: Not on file   Transportation Needs:   . Lack of Transportation (Medical): Not on file  . Lack of Transportation (Non-Medical): Not on file  Physical Activity:   . Days of Exercise per Week: Not on file  . Minutes of Exercise per Session: Not on file  Stress:   . Feeling of Stress : Not on file  Social Connections:   . Frequency of Communication with Friends and Family: Not on file  . Frequency of Social Gatherings with Friends and Family: Not on file  . Attends Religious Services: Not on file  . Active Member of Clubs or Organizations: Not on file  . Attends Archivist Meetings: Not on file  . Marital Status: Not on file    Past Medical History:  Diagnosis Date  . GERD (gastroesophageal reflux disease)   . Hemorrhoid   . Lower leg fracture 1969   right lower leg  . Thrombosed hemorrhoids 08-25-08 and 10-02-09  . Thumb fracture      Patient Active Problem List   Diagnosis Date Noted  . Adiposity 05/23/2019  . GERD (gastroesophageal reflux disease) 10/16/2017    Past Surgical History:  Procedure Laterality Date  . ABSCESS DRAINAGE  09-15-92   perianal  . INCISION AND DRAINAGE  2009, 2010   hemorrhoids  . TONSILLECTOMY  1972  . VASECTOMY      Family History  Family Status  Relation Name Status  . Mother  Alive  . Father  Alive  . Brother  Alive  . Brother  Alive        His family history includes CVA in his father; Colon polyps in his mother; Hypothyroidism in his brother and mother; Stroke in his brother.      No Known Allergies   Current Outpatient Medications:  .  calcium carbonate (TUMS) 500 MG chewable tablet, Chew 1 tablet by mouth daily as needed for indigestion or heartburn., Disp: , Rfl:  .  docusate sodium (COLACE) 100 MG capsule, Take 100 mg by mouth daily as needed for mild constipation., Disp: , Rfl:  .  ibuprofen (ADVIL,MOTRIN) 100 MG/5ML suspension, Take 200 mg by mouth every 4 (four) hours as needed., Disp: , Rfl:  .  omeprazole  (PRILOSEC) 40 MG capsule, Take 1 capsule (40 mg total) by mouth daily., Disp: 90 capsule, Rfl: 3 .  phentermine (ADIPEX-P) 37.5 MG tablet, Take 1 tablet (37.5 mg total) by mouth daily before breakfast., Disp: 30 tablet, Rfl: 3 .  albuterol (PROVENTIL HFA;VENTOLIN HFA) 108 (90 Base) MCG/ACT inhaler, Inhale 2 puffs into the lungs every 6 (six) hours as needed for wheezing or shortness of breath. (Patient not taking: Reported on 05/20/2019), Disp: 1 Inhaler, Rfl: 2 .  cetirizine (ZYRTEC) 10 MG tablet, Take 1 tablet (10 mg total) by mouth daily. (Patient not taking: Reported on 05/20/2019), Disp: 30 tablet, Rfl: 0 .  fluticasone (FLONASE) 50 MCG/ACT nasal spray, Place 2 sprays into both nostrils daily. (Patient not taking: Reported on 05/20/2019), Disp: 16 g, Rfl: 0 .  meloxicam (MOBIC) 7.5 MG tablet, Take 1 tablet (7.5 mg total) by mouth 2 (two) times daily. (Patient not taking: Reported on 05/20/2019), Disp: 60 tablet, Rfl: 1 .  phentermine 37.5 MG capsule, Take 1 capsule (37.5 mg total) by mouth every morning. (Patient not taking: Reported on 08/09/2019), Disp: 30 capsule, Rfl: 5   Patient Care Team: Jerrol Banana., MD as PCP - General (Family Medicine) Christene Lye, MD (General Surgery)    Objective:    Vitals: BP 102/71 (BP Location: Right Arm, Patient Position: Sitting, Cuff Size: Large)   Pulse 72   Temp (!) 97.1 F (36.2 C) (Oral)   Resp 18   Ht 6' (1.829 m)   Wt 223 lb (101.2 kg)   SpO2 97%   BMI 30.24 kg/m    Vitals:   11/30/19 1414  BP: 102/71  Pulse: 72  Resp: 18  Temp: (!) 97.1 F (36.2 C)  TempSrc: Oral  SpO2: 97%  Weight: 223 lb (101.2 kg)  Height: 6' (1.829 m)     Physical Exam Vitals reviewed.  Constitutional:      Appearance: Normal appearance. He is normal weight.  HENT:     Head: Normocephalic and atraumatic.     Right Ear: Tympanic membrane normal.     Left Ear: Tympanic membrane normal.     Nose: Nose normal.     Mouth/Throat:     Mouth:  Mucous membranes are moist.     Pharynx: Oropharynx is clear.  Eyes:     Extraocular Movements: Extraocular movements intact.     Conjunctiva/sclera: Conjunctivae normal.     Pupils: Pupils are equal, round, and reactive to light.  Cardiovascular:     Rate and Rhythm: Normal rate and regular rhythm.     Pulses: Normal pulses.     Heart sounds: Normal heart sounds.  Pulmonary:  Effort: Pulmonary effort is normal.     Breath sounds: Normal breath sounds.  Abdominal:     General: Abdomen is flat. Bowel sounds are normal.     Palpations: Abdomen is soft.  Genitourinary:    Penis: Normal.      Testes: Normal.     Prostate: Normal.     Rectum: Normal.  Musculoskeletal:        General: Normal range of motion.     Cervical back: Normal range of motion and neck supple.  Skin:    General: Skin is warm and dry.  Neurological:     General: No focal deficit present.     Mental Status: He is alert and oriented to person, place, and time. Mental status is at baseline.  Psychiatric:        Mood and Affect: Mood normal.        Behavior: Behavior normal.        Thought Content: Thought content normal.        Judgment: Judgment normal.      Depression Screen PHQ 2/9 Scores 11/30/2019 10/20/2018 10/16/2017  PHQ - 2 Score 0 3 0  PHQ- 9 Score 0 9 5       Assessment & Plan:     Routine Health Maintenance and Physical Exam  Exercise Activities and Dietary recommendations Goals   None     Immunization History  Administered Date(s) Administered  . Influenza Whole 06/07/2017  . Influenza-Unspecified 07/10/2019    Health Maintenance  Topic Date Due  . HIV Screening  10/13/1980  . TETANUS/TDAP  10/13/1984  . COLONOSCOPY  10/14/2015  . INFLUENZA VACCINE  Completed     Discussed health benefits of physical activity, and encouraged him to engage in regular exercise appropriate for his age and condition.     --------------------------------------------------------------------  1. Annual physical exam Pt doing well with lifestyle as he has lost at least 25 lbs in past year. - CBC w/Diff/Platelet - Comprehensive Metabolic Panel (CMET) - Lipid panel - TSH - PSA - POCT urinalysis dipstick- Normal  2. Prostate cancer screening  - PSA  3. Gastroesophageal reflux disease, unspecified whether esophagitis present  - CBC w/Diff/Platelet - Comprehensive Metabolic Panel (CMET) - Lipid panel - TSH  4. Encounter for screening fecal occult blood testing Encouraged pt to get screening colonoscopy--he will discuss with wife--might choose Cologard. - POCT Occult Blood Stool- Negative.   Follow up in 6 months.    I,Sydney Azure,acting as a scribe for [name], MD.,have documented all relevant documentation on the behalf of [name], MD,as directed by  [name], MD while in the presence of [name], MD.   [name], MD  [facility]

## 2019-11-30 ENCOUNTER — Ambulatory Visit (INDEPENDENT_AMBULATORY_CARE_PROVIDER_SITE_OTHER): Payer: 59 | Admitting: Family Medicine

## 2019-11-30 ENCOUNTER — Other Ambulatory Visit: Payer: Self-pay

## 2019-11-30 ENCOUNTER — Encounter: Payer: Self-pay | Admitting: Family Medicine

## 2019-11-30 VITALS — BP 102/71 | HR 72 | Temp 97.1°F | Resp 18 | Ht 72.0 in | Wt 223.0 lb

## 2019-11-30 DIAGNOSIS — K219 Gastro-esophageal reflux disease without esophagitis: Secondary | ICD-10-CM | POA: Diagnosis not present

## 2019-11-30 DIAGNOSIS — Z1211 Encounter for screening for malignant neoplasm of colon: Secondary | ICD-10-CM | POA: Diagnosis not present

## 2019-11-30 DIAGNOSIS — Z0000 Encounter for general adult medical examination without abnormal findings: Secondary | ICD-10-CM | POA: Diagnosis not present

## 2019-11-30 DIAGNOSIS — Z125 Encounter for screening for malignant neoplasm of prostate: Secondary | ICD-10-CM | POA: Diagnosis not present

## 2019-11-30 LAB — POCT URINALYSIS DIPSTICK
Appearance: NORMAL
Bilirubin, UA: NEGATIVE
Blood, UA: NEGATIVE
Glucose, UA: NEGATIVE
Ketones, UA: NEGATIVE
Leukocytes, UA: NEGATIVE
Nitrite, UA: NEGATIVE
Odor: NORMAL
Protein, UA: NEGATIVE
Spec Grav, UA: 1.02 (ref 1.010–1.025)
Urobilinogen, UA: 0.2 E.U./dL
pH, UA: 6 (ref 5.0–8.0)

## 2019-11-30 LAB — HEMOCCULT GUIAC POC 1CARD (OFFICE): Fecal Occult Blood, POC: NEGATIVE

## 2020-02-16 ENCOUNTER — Other Ambulatory Visit: Payer: Self-pay | Admitting: Dermatology

## 2020-02-16 DIAGNOSIS — L400 Psoriasis vulgaris: Secondary | ICD-10-CM | POA: Diagnosis not present

## 2020-02-16 DIAGNOSIS — L821 Other seborrheic keratosis: Secondary | ICD-10-CM | POA: Diagnosis not present

## 2020-02-16 DIAGNOSIS — L218 Other seborrheic dermatitis: Secondary | ICD-10-CM | POA: Diagnosis not present

## 2020-05-25 NOTE — Progress Notes (Signed)
Established patient visit   Patient: Ralph Patrick   DOB: 05/02/1966   54 y.o. Male  MRN: [id number] Visit Date: 05/29/2020  Today's healthcare provider: [name], MD   Mertie Moores as a scribe for [name], MD.,have documented all relevant documentation on the behalf of [name], MD,as directed by  [name], MD while in the presence of [name], MD.  Chief Complaint  Patient presents with  . Gastroesophageal Reflux  . Obesity   Subjective    HPI  He does have a little neck strain/neck crick from recent golf  Exercises.  The discomfort is all in his neck and has limited range of motion with turning of his head but he has no radicular symptoms at all. He wishes to try phentermine for a few more months to try to lose some more weight.  It helped a good bit. GERD, Follow up:  The patient was last seen for GERD 6 months ago. Changes made since that visit include no change.  He reports good compliance with treatment. He is not having side effects. Marland Kitchen  He IS experiencing none. He is NOT experiencing abdominal bloating, belching, belching and eructation, bilious reflux, chest pain, choking on food, cough, deep pressure at base of neck, difficulty swallowing, dysphagia, early satiety, fullness after meals, heartburn, hematemesis, hoarseness, laryngitis, melena, midespigastric pain, nausea, need to clear throat frequently, nocturnal burning, odynophagia, regurgitation of undigested food, shortness of breath, symptoms primarily relate to meals, and lying down after meals, unexpected weight loss, upper abdominal discomfort, waterbrash or wheezing  ----------------------------------------------------------------------------------------- Follow up for Obesity:  The patient was last seen for this 6 months ago. Changes made at last visit include no change.  He reports good compliance with treatment. He feels that condition is  stable. He is not having side effects.   Wt Readings from Last 3 Encounters:  05/29/20 226 lb (102.5 kg)  11/30/19 223 lb (101.2 kg)  08/09/19 226 lb (102.5 kg)    -----------------------------------------------------------------------------------------         Medications: Outpatient Medications Prior to Visit  Medication Sig  . omeprazole (PRILOSEC) 40 MG capsule Take 1 capsule (40 mg total) by mouth daily.  . [DISCONTINUED] phentermine (ADIPEX-P) 37.5 MG tablet Take 1 tablet (37.5 mg total) by mouth daily before breakfast.  . [DISCONTINUED] albuterol (PROVENTIL HFA;VENTOLIN HFA) 108 (90 Base) MCG/ACT inhaler Inhale 2 puffs into the lungs every 6 (six) hours as needed for wheezing or shortness of breath. (Patient not taking: Reported on 05/20/2019)  . [DISCONTINUED] calcium carbonate (TUMS) 500 MG chewable tablet Chew 1 tablet by mouth daily as needed for indigestion or heartburn.  . [DISCONTINUED] cetirizine (ZYRTEC) 10 MG tablet Take 1 tablet (10 mg total) by mouth daily. (Patient not taking: Reported on 05/20/2019)  . [DISCONTINUED] docusate sodium (COLACE) 100 MG capsule Take 100 mg by mouth daily as needed for mild constipation.  . [DISCONTINUED] fluticasone (FLONASE) 50 MCG/ACT nasal spray Place 2 sprays into both nostrils daily. (Patient not taking: Reported on 05/20/2019)  . [DISCONTINUED] ibuprofen (ADVIL,MOTRIN) 100 MG/5ML suspension Take 200 mg by mouth every 4 (four) hours as needed.  . [DISCONTINUED] meloxicam (MOBIC) 7.5 MG tablet Take 1 tablet (7.5 mg total) by mouth 2 (two) times daily. (Patient not taking: Reported on 05/20/2019)  . [DISCONTINUED] phentermine 37.5 MG capsule Take 1 capsule (37.5 mg total) by mouth every morning. (Patient not taking: Reported on 08/09/2019)   No facility-administered medications prior to  visit.    Review of Systems  Constitutional: Negative for appetite change, chills and fever.  Respiratory: Negative for chest tightness, shortness of  breath and wheezing.   Cardiovascular: Negative for chest pain and palpitations.  Gastrointestinal: Negative for abdominal pain, nausea and vomiting.       Objective    BP 97/64 (BP Location: Right Arm, Patient Position: Sitting, Cuff Size: Large)   Pulse 69   Temp 98.2 F (36.8 C) (Oral)   Ht 6' (1.829 m)   Wt 226 lb (102.5 kg)   BMI 30.65 kg/m  BP Readings from Last 3 Encounters:  05/29/20 97/64  11/30/19 102/71  08/09/19 101/65   Wt Readings from Last 3 Encounters:  05/29/20 226 lb (102.5 kg)  11/30/19 223 lb (101.2 kg)  08/09/19 226 lb (102.5 kg)      Physical Exam Vitals reviewed.  Constitutional:      Appearance: Normal appearance. He is normal weight.  HENT:     Head: Normocephalic and atraumatic.     Right Ear: Tympanic membrane normal.     Left Ear: Tympanic membrane normal.     Nose: Nose normal.     Mouth/Throat:     Mouth: Mucous membranes are moist.     Pharynx: Oropharynx is clear.  Eyes:     Extraocular Movements: Extraocular movements intact.     Conjunctiva/sclera: Conjunctivae normal.     Pupils: Pupils are equal, round, and reactive to light.  Cardiovascular:     Rate and Rhythm: Normal rate and regular rhythm.     Pulses: Normal pulses.     Heart sounds: Normal heart sounds.  Pulmonary:     Effort: Pulmonary effort is normal.     Comments: Very mild expiratory wheezes. Abdominal:     General: Abdomen is flat. Bowel sounds are normal.     Palpations: Abdomen is soft.  Musculoskeletal:     Cervical back: Normal range of motion and neck supple.     Comments: Mild decreased range of motion in the neck with negative Spurling's maneuver  Skin:    General: Skin is warm and dry.  Neurological:     General: No focal deficit present.     Mental Status: He is alert and oriented to person, place, and time. Mental status is at baseline.  Psychiatric:        Mood and Affect: Mood normal.        Behavior: Behavior normal.        Thought Content:  Thought content normal.        Judgment: Judgment normal.      General: Appearance:    Obese male in no acute distress  Eyes:    PERRL, conjunctiva/corneas clear, EOM's intact       Lungs:     Clear to auscultation bilaterally, respirations unlabored  Heart:    Normal heart rate. Normal rhythm. No murmurs, rubs, or gallops.   MS:   All extremities are intact.   Neurologic:   Awake, alert, oriented x 3. No apparent focal neurological           defect.         No results found for any visits on 05/29/20.  Assessment & Plan    1. Gastroesophageal reflux disease, unspecified whether esophagitis present Patient states he needs to take omeprazole daily. - CBC with Differential - Comprehensive Metabolic Panel (CMET) - Lipid Profile - TSH  2. Class 1 obesity due to excess calories without serious  comorbidity with body mass index (BMI) of 34.0 to 34.9 in adult  - CBC with Differential - Comprehensive Metabolic Panel (CMET) - Lipid Profile - TSH  3. Prostate cancer screening  - PSA  4. Torticollis  - cyclobenzaprine (FLEXERIL) 10 MG tablet; Take 1 tablet (10 mg total) by mouth 3 (three) times daily as needed for muscle spasms.  Dispense: 30 tablet; Refill: 0  5. Class 1 obesity due to excess calories with serious comorbidity and body mass index (BMI) of 34.0 to 34.9 in adult  - phentermine (ADIPEX-P) 37.5 MG tablet; Take 1 tablet (37.5 mg total) by mouth daily before breakfast.  Dispense: 30 tablet; Refill: 3   Return in about 6 months (around 11/29/2020).         Jayquon Theiler Cranford Mon, MD  [facility] [phone] (phone) [phone] (fax)  [place]

## 2020-05-29 ENCOUNTER — Other Ambulatory Visit: Payer: Self-pay | Admitting: Family Medicine

## 2020-05-29 ENCOUNTER — Encounter: Payer: Self-pay | Admitting: Family Medicine

## 2020-05-29 ENCOUNTER — Ambulatory Visit (INDEPENDENT_AMBULATORY_CARE_PROVIDER_SITE_OTHER): Payer: 59 | Admitting: Family Medicine

## 2020-05-29 ENCOUNTER — Other Ambulatory Visit: Payer: Self-pay

## 2020-05-29 VITALS — BP 97/64 | HR 69 | Temp 98.2°F | Ht 72.0 in | Wt 226.0 lb

## 2020-05-29 DIAGNOSIS — M436 Torticollis: Secondary | ICD-10-CM | POA: Diagnosis not present

## 2020-05-29 DIAGNOSIS — Z125 Encounter for screening for malignant neoplasm of prostate: Secondary | ICD-10-CM | POA: Diagnosis not present

## 2020-05-29 DIAGNOSIS — K219 Gastro-esophageal reflux disease without esophagitis: Secondary | ICD-10-CM | POA: Diagnosis not present

## 2020-05-29 DIAGNOSIS — E6609 Other obesity due to excess calories: Secondary | ICD-10-CM

## 2020-05-29 DIAGNOSIS — Z6834 Body mass index (BMI) 34.0-34.9, adult: Secondary | ICD-10-CM | POA: Diagnosis not present

## 2020-05-29 DIAGNOSIS — E66811 Obesity, class 1: Secondary | ICD-10-CM

## 2020-05-29 MED ORDER — CYCLOBENZAPRINE HCL 10 MG PO TABS: 10.0000 mg | ORAL_TABLET | Freq: Three times a day (TID) | ORAL | 0 refills | Status: DC | PRN

## 2020-05-29 MED ORDER — PHENTERMINE HCL 37.5 MG PO TABS: 37.5000 mg | ORAL_TABLET | Freq: Every day | ORAL | 3 refills | Status: DC

## 2020-06-02 ENCOUNTER — Other Ambulatory Visit
Admission: RE | Admit: 2020-06-02 | Discharge: 2020-06-02 | Disposition: A | Discharge status: Home / Self Care | Payer: 59 | Attending: Family Medicine | Admitting: Family Medicine

## 2020-06-02 ENCOUNTER — Other Ambulatory Visit: Payer: Self-pay

## 2020-06-02 DIAGNOSIS — E6609 Other obesity due to excess calories: Secondary | ICD-10-CM | POA: Insufficient documentation

## 2020-06-02 DIAGNOSIS — K219 Gastro-esophageal reflux disease without esophagitis: Secondary | ICD-10-CM | POA: Diagnosis not present

## 2020-06-02 DIAGNOSIS — Z6834 Body mass index (BMI) 34.0-34.9, adult: Secondary | ICD-10-CM | POA: Insufficient documentation

## 2020-06-02 DIAGNOSIS — Z125 Encounter for screening for malignant neoplasm of prostate: Secondary | ICD-10-CM | POA: Insufficient documentation

## 2020-06-02 LAB — CBC WITH DIFFERENTIAL/PLATELET
Abs Immature Granulocytes: 0.11 10*3/uL — ABNORMAL HIGH (ref 0.00–0.07)
Basophils Absolute: 0 10*3/uL (ref 0.0–0.1)
Basophils Relative: 1 %
Eosinophils Absolute: 0.3 10*3/uL (ref 0.0–0.5)
Eosinophils Relative: 4 %
HCT: 48.3 % (ref 39.0–52.0)
Hemoglobin: 17.4 g/dL — ABNORMAL HIGH (ref 13.0–17.0)
Immature Granulocytes: 2 %
Lymphocytes Relative: 26 %
Lymphs Abs: 1.7 10*3/uL (ref 0.7–4.0)
MCH: 32.6 pg (ref 26.0–34.0)
MCHC: 36 g/dL (ref 30.0–36.0)
MCV: 90.6 fL (ref 80.0–100.0)
Monocytes Absolute: 0.6 10*3/uL (ref 0.1–1.0)
Monocytes Relative: 10 %
Neutro Abs: 3.8 10*3/uL (ref 1.7–7.7)
Neutrophils Relative %: 57 %
Platelets: 220 10*3/uL (ref 150–400)
RBC: 5.33 MIL/uL (ref 4.22–5.81)
RDW: 13.1 % (ref 11.5–15.5)
WBC: 6.5 10*3/uL (ref 4.0–10.5)
nRBC: 0 % (ref 0.0–0.2)

## 2020-06-02 LAB — COMPREHENSIVE METABOLIC PANEL
ALT: 25 U/L (ref 0–44)
AST: 24 U/L (ref 15–41)
Albumin: 4.6 g/dL (ref 3.5–5.0)
Alkaline Phosphatase: 76 U/L (ref 38–126)
Anion gap: 8 (ref 5–15)
BUN: 18 mg/dL (ref 6–20)
CO2: 26 mmol/L (ref 22–32)
Calcium: 9.2 mg/dL (ref 8.9–10.3)
Chloride: 105 mmol/L (ref 98–111)
Creatinine, Ser: 1.26 mg/dL — ABNORMAL HIGH (ref 0.61–1.24)
GFR calc Af Amer: >60 mL/min (ref >60)
GFR calc non Af Amer: >60 mL/min (ref >60)
Glucose, Bld: 113 mg/dL — ABNORMAL HIGH (ref 70–99)
Potassium: 4.4 mmol/L (ref 3.5–5.1)
Sodium: 139 mmol/L (ref 135–145)
Total Bilirubin: 1.1 mg/dL (ref 0.3–1.2)
Total Protein: 7.8 g/dL (ref 6.5–8.1)

## 2020-06-02 LAB — LIPID PANEL
Cholesterol: 251 mg/dL — ABNORMAL HIGH (ref 0–200)
HDL: 52 mg/dL (ref >40)
LDL Cholesterol: 177 mg/dL — ABNORMAL HIGH (ref 0–99)
Total CHOL/HDL Ratio: 4.8 RATIO
Triglycerides: 109 mg/dL (ref <150)
VLDL: 22 mg/dL (ref 0–40)

## 2020-06-02 LAB — TSH: TSH: 3.276 u[IU]/mL (ref 0.350–4.500)

## 2020-06-02 LAB — PSA: Prostatic Specific Antigen: 0.47 ng/mL (ref 0.00–4.00)

## 2020-07-26 DIAGNOSIS — M62830 Muscle spasm of back: Secondary | ICD-10-CM | POA: Diagnosis not present

## 2020-07-26 DIAGNOSIS — M9901 Segmental and somatic dysfunction of cervical region: Secondary | ICD-10-CM | POA: Diagnosis not present

## 2020-07-26 DIAGNOSIS — M5033 Other cervical disc degeneration, cervicothoracic region: Secondary | ICD-10-CM | POA: Diagnosis not present

## 2020-07-26 DIAGNOSIS — M5412 Radiculopathy, cervical region: Secondary | ICD-10-CM | POA: Diagnosis not present

## 2020-07-27 DIAGNOSIS — M5412 Radiculopathy, cervical region: Secondary | ICD-10-CM | POA: Diagnosis not present

## 2020-07-27 DIAGNOSIS — M9901 Segmental and somatic dysfunction of cervical region: Secondary | ICD-10-CM | POA: Diagnosis not present

## 2020-07-27 DIAGNOSIS — M62830 Muscle spasm of back: Secondary | ICD-10-CM | POA: Diagnosis not present

## 2020-07-27 DIAGNOSIS — M5033 Other cervical disc degeneration, cervicothoracic region: Secondary | ICD-10-CM | POA: Diagnosis not present

## 2020-07-31 DIAGNOSIS — M9901 Segmental and somatic dysfunction of cervical region: Secondary | ICD-10-CM | POA: Diagnosis not present

## 2020-07-31 DIAGNOSIS — M5412 Radiculopathy, cervical region: Secondary | ICD-10-CM | POA: Diagnosis not present

## 2020-07-31 DIAGNOSIS — M62830 Muscle spasm of back: Secondary | ICD-10-CM | POA: Diagnosis not present

## 2020-07-31 DIAGNOSIS — M5033 Other cervical disc degeneration, cervicothoracic region: Secondary | ICD-10-CM | POA: Diagnosis not present

## 2020-08-02 DIAGNOSIS — M5412 Radiculopathy, cervical region: Secondary | ICD-10-CM | POA: Diagnosis not present

## 2020-08-02 DIAGNOSIS — M9901 Segmental and somatic dysfunction of cervical region: Secondary | ICD-10-CM | POA: Diagnosis not present

## 2020-08-02 DIAGNOSIS — M5033 Other cervical disc degeneration, cervicothoracic region: Secondary | ICD-10-CM | POA: Diagnosis not present

## 2020-08-02 DIAGNOSIS — M62830 Muscle spasm of back: Secondary | ICD-10-CM | POA: Diagnosis not present

## 2020-08-03 DIAGNOSIS — M9901 Segmental and somatic dysfunction of cervical region: Secondary | ICD-10-CM | POA: Diagnosis not present

## 2020-08-03 DIAGNOSIS — M5033 Other cervical disc degeneration, cervicothoracic region: Secondary | ICD-10-CM | POA: Diagnosis not present

## 2020-08-03 DIAGNOSIS — M5412 Radiculopathy, cervical region: Secondary | ICD-10-CM | POA: Diagnosis not present

## 2020-08-03 DIAGNOSIS — M62830 Muscle spasm of back: Secondary | ICD-10-CM | POA: Diagnosis not present

## 2020-08-07 DIAGNOSIS — M9901 Segmental and somatic dysfunction of cervical region: Secondary | ICD-10-CM | POA: Diagnosis not present

## 2020-08-07 DIAGNOSIS — M5033 Other cervical disc degeneration, cervicothoracic region: Secondary | ICD-10-CM | POA: Diagnosis not present

## 2020-08-07 DIAGNOSIS — M62830 Muscle spasm of back: Secondary | ICD-10-CM | POA: Diagnosis not present

## 2020-08-07 DIAGNOSIS — M5412 Radiculopathy, cervical region: Secondary | ICD-10-CM | POA: Diagnosis not present

## 2020-08-09 DIAGNOSIS — M5412 Radiculopathy, cervical region: Secondary | ICD-10-CM | POA: Diagnosis not present

## 2020-08-09 DIAGNOSIS — M5033 Other cervical disc degeneration, cervicothoracic region: Secondary | ICD-10-CM | POA: Diagnosis not present

## 2020-08-09 DIAGNOSIS — M62830 Muscle spasm of back: Secondary | ICD-10-CM | POA: Diagnosis not present

## 2020-08-09 DIAGNOSIS — M9901 Segmental and somatic dysfunction of cervical region: Secondary | ICD-10-CM | POA: Diagnosis not present

## 2020-08-10 DIAGNOSIS — M62830 Muscle spasm of back: Secondary | ICD-10-CM | POA: Diagnosis not present

## 2020-08-10 DIAGNOSIS — M5033 Other cervical disc degeneration, cervicothoracic region: Secondary | ICD-10-CM | POA: Diagnosis not present

## 2020-08-10 DIAGNOSIS — M9901 Segmental and somatic dysfunction of cervical region: Secondary | ICD-10-CM | POA: Diagnosis not present

## 2020-08-10 DIAGNOSIS — M5412 Radiculopathy, cervical region: Secondary | ICD-10-CM | POA: Diagnosis not present

## 2020-08-14 DIAGNOSIS — M9901 Segmental and somatic dysfunction of cervical region: Secondary | ICD-10-CM | POA: Diagnosis not present

## 2020-08-14 DIAGNOSIS — M5033 Other cervical disc degeneration, cervicothoracic region: Secondary | ICD-10-CM | POA: Diagnosis not present

## 2020-08-14 DIAGNOSIS — M5412 Radiculopathy, cervical region: Secondary | ICD-10-CM | POA: Diagnosis not present

## 2020-08-14 DIAGNOSIS — M62830 Muscle spasm of back: Secondary | ICD-10-CM | POA: Diagnosis not present

## 2020-08-16 DIAGNOSIS — M5033 Other cervical disc degeneration, cervicothoracic region: Secondary | ICD-10-CM | POA: Diagnosis not present

## 2020-08-16 DIAGNOSIS — M62830 Muscle spasm of back: Secondary | ICD-10-CM | POA: Diagnosis not present

## 2020-08-16 DIAGNOSIS — M5412 Radiculopathy, cervical region: Secondary | ICD-10-CM | POA: Diagnosis not present

## 2020-08-16 DIAGNOSIS — M9901 Segmental and somatic dysfunction of cervical region: Secondary | ICD-10-CM | POA: Diagnosis not present

## 2020-08-17 DIAGNOSIS — M62830 Muscle spasm of back: Secondary | ICD-10-CM | POA: Diagnosis not present

## 2020-08-17 DIAGNOSIS — M5033 Other cervical disc degeneration, cervicothoracic region: Secondary | ICD-10-CM | POA: Diagnosis not present

## 2020-08-17 DIAGNOSIS — M5412 Radiculopathy, cervical region: Secondary | ICD-10-CM | POA: Diagnosis not present

## 2020-08-17 DIAGNOSIS — M9901 Segmental and somatic dysfunction of cervical region: Secondary | ICD-10-CM | POA: Diagnosis not present

## 2020-08-22 DIAGNOSIS — M9901 Segmental and somatic dysfunction of cervical region: Secondary | ICD-10-CM | POA: Diagnosis not present

## 2020-08-22 DIAGNOSIS — M5412 Radiculopathy, cervical region: Secondary | ICD-10-CM | POA: Diagnosis not present

## 2020-08-22 DIAGNOSIS — M5033 Other cervical disc degeneration, cervicothoracic region: Secondary | ICD-10-CM | POA: Diagnosis not present

## 2020-08-22 DIAGNOSIS — M62830 Muscle spasm of back: Secondary | ICD-10-CM | POA: Diagnosis not present

## 2020-08-24 DIAGNOSIS — M62830 Muscle spasm of back: Secondary | ICD-10-CM | POA: Diagnosis not present

## 2020-08-24 DIAGNOSIS — M9901 Segmental and somatic dysfunction of cervical region: Secondary | ICD-10-CM | POA: Diagnosis not present

## 2020-08-24 DIAGNOSIS — M5412 Radiculopathy, cervical region: Secondary | ICD-10-CM | POA: Diagnosis not present

## 2020-08-24 DIAGNOSIS — M5033 Other cervical disc degeneration, cervicothoracic region: Secondary | ICD-10-CM | POA: Diagnosis not present

## 2020-08-29 DIAGNOSIS — M62830 Muscle spasm of back: Secondary | ICD-10-CM | POA: Diagnosis not present

## 2020-08-29 DIAGNOSIS — M5033 Other cervical disc degeneration, cervicothoracic region: Secondary | ICD-10-CM | POA: Diagnosis not present

## 2020-08-29 DIAGNOSIS — M5412 Radiculopathy, cervical region: Secondary | ICD-10-CM | POA: Diagnosis not present

## 2020-08-29 DIAGNOSIS — M9901 Segmental and somatic dysfunction of cervical region: Secondary | ICD-10-CM | POA: Diagnosis not present

## 2020-08-31 DIAGNOSIS — M5033 Other cervical disc degeneration, cervicothoracic region: Secondary | ICD-10-CM | POA: Diagnosis not present

## 2020-08-31 DIAGNOSIS — M5412 Radiculopathy, cervical region: Secondary | ICD-10-CM | POA: Diagnosis not present

## 2020-08-31 DIAGNOSIS — M62830 Muscle spasm of back: Secondary | ICD-10-CM | POA: Diagnosis not present

## 2020-08-31 DIAGNOSIS — M9901 Segmental and somatic dysfunction of cervical region: Secondary | ICD-10-CM | POA: Diagnosis not present

## 2020-09-04 DIAGNOSIS — M9901 Segmental and somatic dysfunction of cervical region: Secondary | ICD-10-CM | POA: Diagnosis not present

## 2020-09-04 DIAGNOSIS — M5033 Other cervical disc degeneration, cervicothoracic region: Secondary | ICD-10-CM | POA: Diagnosis not present

## 2020-09-04 DIAGNOSIS — M62830 Muscle spasm of back: Secondary | ICD-10-CM | POA: Diagnosis not present

## 2020-09-04 DIAGNOSIS — M5412 Radiculopathy, cervical region: Secondary | ICD-10-CM | POA: Diagnosis not present

## 2020-09-06 DIAGNOSIS — M62830 Muscle spasm of back: Secondary | ICD-10-CM | POA: Diagnosis not present

## 2020-09-06 DIAGNOSIS — M9901 Segmental and somatic dysfunction of cervical region: Secondary | ICD-10-CM | POA: Diagnosis not present

## 2020-09-06 DIAGNOSIS — M5412 Radiculopathy, cervical region: Secondary | ICD-10-CM | POA: Diagnosis not present

## 2020-09-06 DIAGNOSIS — M5033 Other cervical disc degeneration, cervicothoracic region: Secondary | ICD-10-CM | POA: Diagnosis not present

## 2020-09-12 DIAGNOSIS — M9901 Segmental and somatic dysfunction of cervical region: Secondary | ICD-10-CM | POA: Diagnosis not present

## 2020-09-12 DIAGNOSIS — M62830 Muscle spasm of back: Secondary | ICD-10-CM | POA: Diagnosis not present

## 2020-09-12 DIAGNOSIS — M5033 Other cervical disc degeneration, cervicothoracic region: Secondary | ICD-10-CM | POA: Diagnosis not present

## 2020-09-12 DIAGNOSIS — M5412 Radiculopathy, cervical region: Secondary | ICD-10-CM | POA: Diagnosis not present

## 2020-09-14 DIAGNOSIS — M62830 Muscle spasm of back: Secondary | ICD-10-CM | POA: Diagnosis not present

## 2020-09-14 DIAGNOSIS — M9901 Segmental and somatic dysfunction of cervical region: Secondary | ICD-10-CM | POA: Diagnosis not present

## 2020-09-14 DIAGNOSIS — M5412 Radiculopathy, cervical region: Secondary | ICD-10-CM | POA: Diagnosis not present

## 2020-09-14 DIAGNOSIS — M5033 Other cervical disc degeneration, cervicothoracic region: Secondary | ICD-10-CM | POA: Diagnosis not present

## 2020-09-20 DIAGNOSIS — M5033 Other cervical disc degeneration, cervicothoracic region: Secondary | ICD-10-CM | POA: Diagnosis not present

## 2020-09-20 DIAGNOSIS — M62830 Muscle spasm of back: Secondary | ICD-10-CM | POA: Diagnosis not present

## 2020-09-20 DIAGNOSIS — M5412 Radiculopathy, cervical region: Secondary | ICD-10-CM | POA: Diagnosis not present

## 2020-09-20 DIAGNOSIS — M9901 Segmental and somatic dysfunction of cervical region: Secondary | ICD-10-CM | POA: Diagnosis not present

## 2020-09-27 DIAGNOSIS — M9901 Segmental and somatic dysfunction of cervical region: Secondary | ICD-10-CM | POA: Diagnosis not present

## 2020-09-27 DIAGNOSIS — M5033 Other cervical disc degeneration, cervicothoracic region: Secondary | ICD-10-CM | POA: Diagnosis not present

## 2020-09-27 DIAGNOSIS — M5412 Radiculopathy, cervical region: Secondary | ICD-10-CM | POA: Diagnosis not present

## 2020-09-27 DIAGNOSIS — M62830 Muscle spasm of back: Secondary | ICD-10-CM | POA: Diagnosis not present

## 2020-10-04 DIAGNOSIS — M5033 Other cervical disc degeneration, cervicothoracic region: Secondary | ICD-10-CM | POA: Diagnosis not present

## 2020-10-04 DIAGNOSIS — M62830 Muscle spasm of back: Secondary | ICD-10-CM | POA: Diagnosis not present

## 2020-10-04 DIAGNOSIS — M5412 Radiculopathy, cervical region: Secondary | ICD-10-CM | POA: Diagnosis not present

## 2020-10-04 DIAGNOSIS — M9901 Segmental and somatic dysfunction of cervical region: Secondary | ICD-10-CM | POA: Diagnosis not present

## 2020-10-11 DIAGNOSIS — M62830 Muscle spasm of back: Secondary | ICD-10-CM | POA: Diagnosis not present

## 2020-10-11 DIAGNOSIS — M5033 Other cervical disc degeneration, cervicothoracic region: Secondary | ICD-10-CM | POA: Diagnosis not present

## 2020-10-11 DIAGNOSIS — M9901 Segmental and somatic dysfunction of cervical region: Secondary | ICD-10-CM | POA: Diagnosis not present

## 2020-10-11 DIAGNOSIS — M5412 Radiculopathy, cervical region: Secondary | ICD-10-CM | POA: Diagnosis not present

## 2020-10-18 DIAGNOSIS — M62830 Muscle spasm of back: Secondary | ICD-10-CM | POA: Diagnosis not present

## 2020-10-18 DIAGNOSIS — M5033 Other cervical disc degeneration, cervicothoracic region: Secondary | ICD-10-CM | POA: Diagnosis not present

## 2020-10-18 DIAGNOSIS — M5412 Radiculopathy, cervical region: Secondary | ICD-10-CM | POA: Diagnosis not present

## 2020-10-18 DIAGNOSIS — M9901 Segmental and somatic dysfunction of cervical region: Secondary | ICD-10-CM | POA: Diagnosis not present

## 2020-10-25 DIAGNOSIS — M5412 Radiculopathy, cervical region: Secondary | ICD-10-CM | POA: Diagnosis not present

## 2020-10-25 DIAGNOSIS — M62830 Muscle spasm of back: Secondary | ICD-10-CM | POA: Diagnosis not present

## 2020-10-25 DIAGNOSIS — M5033 Other cervical disc degeneration, cervicothoracic region: Secondary | ICD-10-CM | POA: Diagnosis not present

## 2020-10-25 DIAGNOSIS — M9901 Segmental and somatic dysfunction of cervical region: Secondary | ICD-10-CM | POA: Diagnosis not present

## 2020-11-01 DIAGNOSIS — M5412 Radiculopathy, cervical region: Secondary | ICD-10-CM | POA: Diagnosis not present

## 2020-11-01 DIAGNOSIS — M62830 Muscle spasm of back: Secondary | ICD-10-CM | POA: Diagnosis not present

## 2020-11-01 DIAGNOSIS — M5033 Other cervical disc degeneration, cervicothoracic region: Secondary | ICD-10-CM | POA: Diagnosis not present

## 2020-11-01 DIAGNOSIS — M9901 Segmental and somatic dysfunction of cervical region: Secondary | ICD-10-CM | POA: Diagnosis not present

## 2020-11-15 DIAGNOSIS — M62830 Muscle spasm of back: Secondary | ICD-10-CM | POA: Diagnosis not present

## 2020-11-15 DIAGNOSIS — M9901 Segmental and somatic dysfunction of cervical region: Secondary | ICD-10-CM | POA: Diagnosis not present

## 2020-11-15 DIAGNOSIS — M5033 Other cervical disc degeneration, cervicothoracic region: Secondary | ICD-10-CM | POA: Diagnosis not present

## 2020-11-15 DIAGNOSIS — M5412 Radiculopathy, cervical region: Secondary | ICD-10-CM | POA: Diagnosis not present

## 2020-11-22 DIAGNOSIS — M62830 Muscle spasm of back: Secondary | ICD-10-CM | POA: Diagnosis not present

## 2020-11-22 DIAGNOSIS — M5412 Radiculopathy, cervical region: Secondary | ICD-10-CM | POA: Diagnosis not present

## 2020-11-22 DIAGNOSIS — M5033 Other cervical disc degeneration, cervicothoracic region: Secondary | ICD-10-CM | POA: Diagnosis not present

## 2020-11-22 DIAGNOSIS — M9901 Segmental and somatic dysfunction of cervical region: Secondary | ICD-10-CM | POA: Diagnosis not present

## 2020-11-29 DIAGNOSIS — M5412 Radiculopathy, cervical region: Secondary | ICD-10-CM | POA: Diagnosis not present

## 2020-11-29 DIAGNOSIS — M9901 Segmental and somatic dysfunction of cervical region: Secondary | ICD-10-CM | POA: Diagnosis not present

## 2020-11-29 DIAGNOSIS — M5033 Other cervical disc degeneration, cervicothoracic region: Secondary | ICD-10-CM | POA: Diagnosis not present

## 2020-11-29 DIAGNOSIS — M62830 Muscle spasm of back: Secondary | ICD-10-CM | POA: Diagnosis not present

## 2020-12-06 DIAGNOSIS — M5033 Other cervical disc degeneration, cervicothoracic region: Secondary | ICD-10-CM | POA: Diagnosis not present

## 2020-12-06 DIAGNOSIS — M62830 Muscle spasm of back: Secondary | ICD-10-CM | POA: Diagnosis not present

## 2020-12-06 DIAGNOSIS — M9901 Segmental and somatic dysfunction of cervical region: Secondary | ICD-10-CM | POA: Diagnosis not present

## 2020-12-06 DIAGNOSIS — M5412 Radiculopathy, cervical region: Secondary | ICD-10-CM | POA: Diagnosis not present

## 2020-12-13 DIAGNOSIS — M9901 Segmental and somatic dysfunction of cervical region: Secondary | ICD-10-CM | POA: Diagnosis not present

## 2020-12-13 DIAGNOSIS — M62830 Muscle spasm of back: Secondary | ICD-10-CM | POA: Diagnosis not present

## 2020-12-13 DIAGNOSIS — M5033 Other cervical disc degeneration, cervicothoracic region: Secondary | ICD-10-CM | POA: Diagnosis not present

## 2020-12-13 DIAGNOSIS — M5412 Radiculopathy, cervical region: Secondary | ICD-10-CM | POA: Diagnosis not present

## 2020-12-27 ENCOUNTER — Ambulatory Visit (INDEPENDENT_AMBULATORY_CARE_PROVIDER_SITE_OTHER): Payer: 59 | Admitting: Family Medicine

## 2020-12-27 ENCOUNTER — Encounter: Payer: Self-pay | Admitting: Family Medicine

## 2020-12-27 ENCOUNTER — Other Ambulatory Visit: Payer: Self-pay

## 2020-12-27 VITALS — BP 103/69 | HR 74 | Temp 98.4°F | Resp 16 | Ht 72.0 in | Wt 239.0 lb

## 2020-12-27 DIAGNOSIS — M5033 Other cervical disc degeneration, cervicothoracic region: Secondary | ICD-10-CM | POA: Diagnosis not present

## 2020-12-27 DIAGNOSIS — Z1211 Encounter for screening for malignant neoplasm of colon: Secondary | ICD-10-CM | POA: Diagnosis not present

## 2020-12-27 DIAGNOSIS — M5412 Radiculopathy, cervical region: Secondary | ICD-10-CM | POA: Diagnosis not present

## 2020-12-27 DIAGNOSIS — Z0000 Encounter for general adult medical examination without abnormal findings: Secondary | ICD-10-CM

## 2020-12-27 DIAGNOSIS — Z6834 Body mass index (BMI) 34.0-34.9, adult: Secondary | ICD-10-CM | POA: Diagnosis not present

## 2020-12-27 DIAGNOSIS — M9901 Segmental and somatic dysfunction of cervical region: Secondary | ICD-10-CM | POA: Diagnosis not present

## 2020-12-27 DIAGNOSIS — E6609 Other obesity due to excess calories: Secondary | ICD-10-CM

## 2020-12-27 DIAGNOSIS — M62830 Muscle spasm of back: Secondary | ICD-10-CM | POA: Diagnosis not present

## 2020-12-27 DIAGNOSIS — Z1389 Encounter for screening for other disorder: Secondary | ICD-10-CM | POA: Diagnosis not present

## 2020-12-27 LAB — POCT URINALYSIS DIPSTICK
Bilirubin, UA: NEGATIVE
Blood, UA: NEGATIVE
Glucose, UA: NEGATIVE
Ketones, UA: NEGATIVE
Leukocytes, UA: NEGATIVE
Nitrite, UA: NEGATIVE
Protein, UA: NEGATIVE
Spec Grav, UA: 1.025 (ref 1.010–1.025)
Urobilinogen, UA: 0.2 E.U./dL
pH, UA: 6 (ref 5.0–8.0)

## 2020-12-27 NOTE — Progress Notes (Deleted)
     I,Shanquita Ronning,acting as a scribe for [name], MD.,have documented all relevant documentation on the behalf of [name], MD,as directed by  [name], MD while in the presence of [name], MD.   Established patient visit   Patient: Ralph Patrick   DOB: 1966/07/14   55 y.o. Male  MRN: [id number] Visit Date: 12/27/2020  Today's healthcare provider: [name], MD   Chief Complaint  Patient presents with  . Follow-up  . Gastroesophageal Reflux  . Obesity   Subjective    HPI  Gastroesophageal reflux disease, unspecified whether esophagitis present From 05/29/2020-Patient states he needs to take omeprazole daily.  Class 1 obesity due to excess calories with serious comorbidity and body mass index (BMI) of 34.0 to 34.9 in adult From 05/29/2020-given phentermine 37.5 mg.   {Show patient history (optional):23778::" "}   Medications: Outpatient Medications Prior to Visit  Medication Sig  . cyclobenzaprine (FLEXERIL) 10 MG tablet Take 1 tablet (10 mg total) by mouth 3 (three) times daily as needed for muscle spasms.  Marland Kitchen omeprazole (PRILOSEC) 40 MG capsule Take 1 capsule (40 mg total) by mouth daily.  . phentermine (ADIPEX-P) 37.5 MG tablet Take 1 tablet (37.5 mg total) by mouth daily before breakfast.   No facility-administered medications prior to visit.    Review of Systems  Constitutional: Negative for appetite change, chills and fever.  Respiratory: Negative for chest tightness, shortness of breath and wheezing.   Cardiovascular: Negative for chest pain and palpitations.  Gastrointestinal: Negative for abdominal pain, nausea and vomiting.    {Labs  Heme  Chem  Endocrine  Serology  Results Review (optional):23779::" "}   Objective    BP 103/69 (BP Location: Right Arm, Patient Position: Sitting, Cuff Size: Large)   Pulse 74   Temp 98.4 F (36.9 C) (Oral)   Resp 16   Ht 6' (1.829 m)   Wt 239 lb (108.4 kg)   SpO2  96%   BMI 32.41 kg/m  {Show previous vital signs (optional):23777::" "}   Physical Exam  ***  No results found for any visits on 12/27/20.  Assessment & Plan     ***  No follow-ups on file.      {provider attestation***:1}   [name], MD  [facility] [phone] (phone) [phone] (fax)  [place]

## 2020-12-27 NOTE — Progress Notes (Signed)
I,Ralph Patrick,acting as a scribe for Ralph Durie, MD.,have documented all relevant documentation on the behalf of Ralph Durie, MD,as directed by  Ralph Durie, MD while in the presence of Ralph Durie, MD.  Complete physical exam   Patient: Ralph Patrick   DOB: May 31, 1966   55 y.o. Male  MRN: [id number] Visit Date: 12/27/2020  Today's healthcare provider: [name], MD   Chief Complaint  Patient presents with  . Follow-up  . Gastroesophageal Reflux  . Obesity   Subjective    [name] is a 55 y.o. male who presents today for a complete physical exam.  He reports consuming a general diet. The patient does not participate in regular exercise at present. He generally feels well. He reports sleeping fairly well. He does not have additional problems to discuss today.  HPI    Past Medical History:  Diagnosis Date  . GERD (gastroesophageal reflux disease)   . Hemorrhoid   . Lower leg fracture 1969   right lower leg  . Thrombosed hemorrhoids 08-25-08 and 10-02-09  . Thumb fracture    Past Surgical History:  Procedure Laterality Date  . ABSCESS DRAINAGE  09-15-92   perianal  . INCISION AND DRAINAGE  2009, 2010   hemorrhoids  . TONSILLECTOMY  1972  . VASECTOMY     Social History   Socioeconomic History  . Marital status: Married    Spouse name: Not on file  . Number of children: Not on file  . Years of education: Not on file  . Highest education level: Not on file  Occupational History  . Not on file  Tobacco Use  . Smoking status: Never Smoker  . Smokeless tobacco: Never Used  Substance and Sexual Activity  . Alcohol use: No    Alcohol/week: 0.0 standard drinks  . Drug use: No  . Sexual activity: Not on file  Other Topics Concern  . Not on file  Social History Narrative  . Not on file   Social Determinants of Health   Financial Resource Strain: Not on file  Food Insecurity: Not on file  Transportation Needs: Not  on file  Physical Activity: Not on file  Stress: Not on file  Social Connections: Not on file  Intimate Partner Violence: Not on file   Family Status  Relation Name Status  . Mother  Alive  . Father  Alive  . Brother  Alive  . Brother  Alive   Family History  Problem Relation Age of Onset  . Colon polyps Mother   . Hypothyroidism Mother   . CVA Father   . Stroke Brother   . Hypothyroidism Brother    No Known Allergies  Patient Care Team: Jerrol Banana., MD as PCP - General (Family Medicine) Christene Lye, MD (General Surgery)   Medications: Outpatient Medications Prior to Visit  Medication Sig  . cyclobenzaprine (FLEXERIL) 10 MG tablet Take 1 tablet (10 mg total) by mouth 3 (three) times daily as needed for muscle spasms.  Marland Kitchen omeprazole (PRILOSEC) 40 MG capsule Take 1 capsule (40 mg total) by mouth daily.  . phentermine (ADIPEX-P) 37.5 MG tablet Take 1 tablet (37.5 mg total) by mouth daily before breakfast.   No facility-administered medications prior to visit.    Review of Systems     Objective    BP 103/69 (BP Location: Right Arm, Patient Position: Sitting, Cuff Size: Large)   Pulse 74   Temp 98.4  F (36.9 C) (Oral)   Resp 16   Ht 6' (1.829 m)   Wt 239 lb (108.4 kg)   SpO2 96%   BMI 32.41 kg/m  Wt Readings from Last 3 Encounters:  12/27/20 239 lb (108.4 kg)  05/29/20 226 lb (102.5 kg)  11/30/19 223 lb (101.2 kg)      Physical Exam Vitals reviewed.  Constitutional:      Appearance: Normal appearance. He is normal weight.  HENT:     Head: Normocephalic and atraumatic.     Right Ear: Tympanic membrane normal.     Left Ear: Tympanic membrane normal.     Nose: Nose normal.     Mouth/Throat:     Mouth: Mucous membranes are moist.     Pharynx: Oropharynx is clear.  Eyes:     Extraocular Movements: Extraocular movements intact.     Conjunctiva/sclera: Conjunctivae normal.     Pupils: Pupils are equal, round, and reactive to light.   Cardiovascular:     Rate and Rhythm: Normal rate and regular rhythm.     Pulses: Normal pulses.     Heart sounds: Normal heart sounds.  Pulmonary:     Effort: Pulmonary effort is normal.     Breath sounds: Normal breath sounds.  Abdominal:     General: Abdomen is flat. Bowel sounds are normal.     Palpations: Abdomen is soft.  Genitourinary:    Penis: Normal.      Testes: Normal.  Musculoskeletal:        General: Normal range of motion.     Cervical back: Normal range of motion and neck supple.  Skin:    General: Skin is warm and dry.  Neurological:     General: No focal deficit present.     Mental Status: He is alert and oriented to person, place, and time. Mental status is at baseline.  Psychiatric:        Mood and Affect: Mood normal.        Behavior: Behavior normal.        Thought Content: Thought content normal.        Judgment: Judgment normal.       Last depression screening scores PHQ 2/9 Scores 12/27/2020 11/30/2019 10/20/2018  PHQ - 2 Score 2 0 3  PHQ- 9 Score 8 0 9   Last fall risk screening Fall Risk  05/29/2020  Falls in the past year? 0  Number falls in past yr: 0  Injury with Fall? 0   Last Audit-C alcohol use screening Alcohol Use Disorder Test (AUDIT) 12/27/2020  1. How often do you have a drink containing alcohol? 2  2. How many drinks containing alcohol do you have on a typical day when you are drinking? 0  3. How often do you have six or more drinks on one occasion? 0  AUDIT-C Score 2  Alcohol Brief Interventions/Follow-up AUDIT Score <7 follow-up not indicated   A score of 3 or more in women, and 4 or more in men indicates increased risk for alcohol abuse, EXCEPT if all of the points are from question 1   Results for orders placed or performed in visit on 12/27/20  POCT urinalysis dipstick  Result Value Ref Range   Color, UA Yellow    Clarity, UA Clear    Glucose, UA Negative Negative   Bilirubin, UA Negative    Ketones, UA Negative    Spec  Grav, UA 1.025 1.010 - 1.025   Blood, UA Negative  pH, UA 6.0 5.0 - 8.0   Protein, UA Negative Negative   Urobilinogen, UA 0.2 0.2 or 1.0 E.U./dL   Nitrite, UA Negative    Leukocytes, UA Negative Negative    Assessment & Plan    Routine Health Maintenance and Physical Exam  Exercise Activities and Dietary recommendations Goals   None     Immunization History  Administered Date(s) Administered  . Influenza Whole 06/07/2017  . Influenza-Unspecified 07/10/2019    Health Maintenance  Topic Date Due  . Hepatitis C Screening  Never done  . COVID-19 Vaccine (1) Never done  . HIV Screening  Never done  . TETANUS/TDAP  Never done  . COLONOSCOPY (Pts 45-91yrs Insurance coverage will need to be confirmed)  Never done  . INFLUENZA VACCINE  05/14/2020  . HPV VACCINES  Aged Out    Discussed health benefits of physical activity, and encouraged him to engage in regular exercise appropriate for his age and condition.  1. Annual physical exam   2. Screening for blood or protein in urine  - POCT urinalysis dipstick--Negative  3. Screening for colon cancer Patient declines colonoscopy but is willing to do Cologuard. - Cologuard 4.  Obesity Follow  up 6 months to check weight  Return in about 6 months (around 06/29/2021).     I, Ralph Durie, MD, have reviewed all documentation for this visit. The documentation on 12/30/20 for the exam, diagnosis, procedures, and orders are all accurate and complete.    Nishtha Raider Cranford Mon, MD  [facility] [phone] (phone) [phone] (fax)  [place]

## 2021-01-10 DIAGNOSIS — M5033 Other cervical disc degeneration, cervicothoracic region: Secondary | ICD-10-CM | POA: Diagnosis not present

## 2021-01-10 DIAGNOSIS — M9901 Segmental and somatic dysfunction of cervical region: Secondary | ICD-10-CM | POA: Diagnosis not present

## 2021-01-10 DIAGNOSIS — M62830 Muscle spasm of back: Secondary | ICD-10-CM | POA: Diagnosis not present

## 2021-01-10 DIAGNOSIS — M5412 Radiculopathy, cervical region: Secondary | ICD-10-CM | POA: Diagnosis not present

## 2021-01-23 DIAGNOSIS — M9901 Segmental and somatic dysfunction of cervical region: Secondary | ICD-10-CM | POA: Diagnosis not present

## 2021-01-23 DIAGNOSIS — M62830 Muscle spasm of back: Secondary | ICD-10-CM | POA: Diagnosis not present

## 2021-01-23 DIAGNOSIS — M5033 Other cervical disc degeneration, cervicothoracic region: Secondary | ICD-10-CM | POA: Diagnosis not present

## 2021-01-23 DIAGNOSIS — M5412 Radiculopathy, cervical region: Secondary | ICD-10-CM | POA: Diagnosis not present

## 2021-01-29 DIAGNOSIS — M9901 Segmental and somatic dysfunction of cervical region: Secondary | ICD-10-CM | POA: Diagnosis not present

## 2021-01-29 DIAGNOSIS — M62830 Muscle spasm of back: Secondary | ICD-10-CM | POA: Diagnosis not present

## 2021-01-29 DIAGNOSIS — M5033 Other cervical disc degeneration, cervicothoracic region: Secondary | ICD-10-CM | POA: Diagnosis not present

## 2021-01-29 DIAGNOSIS — M5412 Radiculopathy, cervical region: Secondary | ICD-10-CM | POA: Diagnosis not present

## 2021-02-05 DIAGNOSIS — Z1211 Encounter for screening for malignant neoplasm of colon: Secondary | ICD-10-CM | POA: Diagnosis not present

## 2021-02-07 DIAGNOSIS — M9901 Segmental and somatic dysfunction of cervical region: Secondary | ICD-10-CM | POA: Diagnosis not present

## 2021-02-07 DIAGNOSIS — M62830 Muscle spasm of back: Secondary | ICD-10-CM | POA: Diagnosis not present

## 2021-02-07 DIAGNOSIS — M5033 Other cervical disc degeneration, cervicothoracic region: Secondary | ICD-10-CM | POA: Diagnosis not present

## 2021-02-07 DIAGNOSIS — M5412 Radiculopathy, cervical region: Secondary | ICD-10-CM | POA: Diagnosis not present

## 2021-02-08 LAB — COLOGUARD
COLOGUARD: NEGATIVE
Cologuard: NEGATIVE
Cologuard: NEGATIVE

## 2021-02-15 ENCOUNTER — Emergency Department
Admission: EM | Admit: 2021-02-15 | Discharge: 2021-02-15 | Disposition: A | Discharge status: Home / Self Care | Payer: 59 | Attending: Emergency Medicine | Admitting: Emergency Medicine

## 2021-02-15 ENCOUNTER — Other Ambulatory Visit: Payer: Self-pay

## 2021-02-15 ENCOUNTER — Emergency Department: Payer: 59

## 2021-02-15 DIAGNOSIS — K219 Gastro-esophageal reflux disease without esophagitis: Secondary | ICD-10-CM | POA: Insufficient documentation

## 2021-02-15 DIAGNOSIS — R42 Dizziness and giddiness: Secondary | ICD-10-CM | POA: Insufficient documentation

## 2021-02-15 DIAGNOSIS — R531 Weakness: Secondary | ICD-10-CM | POA: Diagnosis not present

## 2021-02-15 DIAGNOSIS — R112 Nausea with vomiting, unspecified: Secondary | ICD-10-CM | POA: Diagnosis not present

## 2021-02-15 LAB — CBC WITH DIFFERENTIAL/PLATELET
Abs Immature Granulocytes: 0.25 10*3/uL — ABNORMAL HIGH (ref 0.00–0.07)
Basophils Absolute: 0.1 10*3/uL (ref 0.0–0.1)
Basophils Relative: 1 %
Eosinophils Absolute: 0.2 10*3/uL (ref 0.0–0.5)
Eosinophils Relative: 2 %
HCT: 47.2 % (ref 39.0–52.0)
Hemoglobin: 17 g/dL (ref 13.0–17.0)
Immature Granulocytes: 2 %
Lymphocytes Relative: 31 %
Lymphs Abs: 3.7 10*3/uL (ref 0.7–4.0)
MCH: 32.4 pg (ref 26.0–34.0)
MCHC: 36 g/dL (ref 30.0–36.0)
MCV: 89.9 fL (ref 80.0–100.0)
Monocytes Absolute: 1.1 10*3/uL — ABNORMAL HIGH (ref 0.1–1.0)
Monocytes Relative: 9 %
Neutro Abs: 6.5 10*3/uL (ref 1.7–7.7)
Neutrophils Relative %: 55 %
Platelets: 278 10*3/uL (ref 150–400)
RBC: 5.25 MIL/uL (ref 4.22–5.81)
RDW: 13.1 % (ref 11.5–15.5)
WBC: 11.8 10*3/uL — ABNORMAL HIGH (ref 4.0–10.5)
nRBC: 0.2 % (ref 0.0–0.2)

## 2021-02-15 LAB — COMPREHENSIVE METABOLIC PANEL
ALT: 30 U/L (ref 0–44)
AST: 37 U/L (ref 15–41)
Albumin: 4.8 g/dL (ref 3.5–5.0)
Alkaline Phosphatase: 66 U/L (ref 38–126)
Anion gap: 13 (ref 5–15)
BUN: 20 mg/dL (ref 6–20)
CO2: 20 mmol/L — ABNORMAL LOW (ref 22–32)
Calcium: 9.5 mg/dL (ref 8.9–10.3)
Chloride: 106 mmol/L (ref 98–111)
Creatinine, Ser: 1.03 mg/dL (ref 0.61–1.24)
GFR, Estimated: >60 mL/min (ref >60)
Glucose, Bld: 132 mg/dL — ABNORMAL HIGH (ref 70–99)
Potassium: 3.4 mmol/L — ABNORMAL LOW (ref 3.5–5.1)
Sodium: 139 mmol/L (ref 135–145)
Total Bilirubin: 1 mg/dL (ref 0.3–1.2)
Total Protein: 7.8 g/dL (ref 6.5–8.1)

## 2021-02-15 LAB — MAGNESIUM: Magnesium: 2.1 mg/dL (ref 1.7–2.4)

## 2021-02-15 LAB — TROPONIN I (HIGH SENSITIVITY)
Troponin I (High Sensitivity): 5 ng/L (ref <18)
Troponin I (High Sensitivity): 3 ng/L (ref <18)

## 2021-02-15 MED ORDER — ONDANSETRON HCL 4 MG/2ML IJ SOLN: 4.0000 mg | Freq: Once | INTRAMUSCULAR | Status: AC

## 2021-02-15 MED ORDER — ONDANSETRON HCL 4 MG/2ML IJ SOLN
INTRAMUSCULAR | Status: AC
  Administered 2021-02-15: 4 mg via INTRAVENOUS
  Filled 2021-02-15: qty 2

## 2021-02-15 MED ORDER — POTASSIUM CHLORIDE CRYS ER 20 MEQ PO TBCR
40.0000 meq | EXTENDED_RELEASE_TABLET | Freq: Once | ORAL | Status: AC
  Administered 2021-02-15: 40 meq via ORAL
  Filled 2021-02-15: qty 2

## 2021-02-15 MED ORDER — LACTATED RINGERS IV BOLUS
1000.0000 mL | Freq: Once | INTRAVENOUS | Status: AC
  Administered 2021-02-15: 1000 mL via INTRAVENOUS

## 2021-02-15 MED ORDER — ONDANSETRON 4 MG PO TBDP
4.0000 mg | ORAL_TABLET | Freq: Three times a day (TID) | ORAL | 0 refills | Status: DC | PRN
  Filled 2021-02-15: qty 12, 4d supply, fill #0

## 2021-02-15 MED ORDER — PROCHLORPERAZINE EDISYLATE 10 MG/2ML IJ SOLN
10.0000 mg | Freq: Once | INTRAMUSCULAR | Status: AC
  Administered 2021-02-15: 10 mg via INTRAVENOUS
  Filled 2021-02-15: qty 2

## 2021-02-15 NOTE — ED Provider Notes (Signed)
-----------------------------------------   4:06 PM on 02/15/2021 -----------------------------------------  Repeat troponin remains negative.  Patient feels well.  We will discharge the patient home with PCP follow-up.   Harvest Dark, MD 02/15/21 1606

## 2021-02-15 NOTE — ED Notes (Signed)
Patient updated on POC. Patient denies needs. Patient reports improvement to symptoms.

## 2021-02-15 NOTE — ED Provider Notes (Signed)
[facility] Emergency Department Provider Note   ____________________________________________   Event Date/Time   First MD Initiated Contact with Patient 02/15/21 1246     (approximate)  I have reviewed the triage vital signs and the nursing notes.   HISTORY  Chief Complaint Dizziness    HPI Ralph Patrick is a 55 y.o. male with past medical history of GERD who presents to the ED complaining of dizziness.  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  This was initially present intermittently and worse when he was going up the elevator.  It seemed to gradually worsen throughout the day and he became extremely dizzy just prior to arrival with nausea and multiple episodes of vomiting.  He denies any associated chest pain or shortness of breath.  He reports dealing with similar symptoms many years ago that resolved on their own.  There was associated numbness and tingling in his hands but he denies any weakness.        Past Medical History:  Diagnosis Date  . GERD (gastroesophageal reflux disease)   . Hemorrhoid   . Lower leg fracture 1969   right lower leg  . Thrombosed hemorrhoids 08-25-08 and 10-02-09  . Thumb fracture     Patient Active Problem List   Diagnosis Date Noted  . Adiposity 05/23/2019  . GERD (gastroesophageal reflux disease) 10/16/2017    Past Surgical History:  Procedure Laterality Date  . ABSCESS DRAINAGE  09-15-92   perianal  . INCISION AND DRAINAGE  2009, 2010   hemorrhoids  . TONSILLECTOMY  1972  . VASECTOMY      Prior to Admission medications   Medication Sig Start Date End Date Taking? Authorizing Provider  clobetasol cream (TEMOVATE) 0.05 % APPLY TO PSORIASIS ON ELBOWS TWICE DAILY UNTIL CLEAR 02/16/20 02/15/21 Yes Isenstein, Arin L, MD  desonide (DESOWEN) 0.05  % cream APPLY TO AFFECTED AREAS ON FACE TWICE DAILY WHEN FLARED 02/16/20 02/15/21 Yes Isenstein, Arin L, MD  ketoconazole (NIZORAL) 2 % shampoo USE ON SCALP, FACE AND CHEST WHEN WASHING, AT LEAST 2 TO 3 TIMES WEEKLY. LATHER AND LEAVE ON 5 MINUTES, RINSE WELL. 02/16/20 02/15/21 Yes Isenstein, Arin L, MD  omeprazole (PRILOSEC) 40 MG capsule Take 1 capsule (40 mg total) by mouth daily. 11/19/18  Yes Jerrol Banana., MD  ondansetron (ZOFRAN ODT) 4 MG disintegrating tablet Take 1 tablet (4 mg total) by mouth every 8 (eight) hours as needed for nausea or vomiting. 02/15/21  Yes Blake Divine, MD  cyclobenzaprine (FLEXERIL) 10 MG tablet Take 1 tablet (10 mg total) by mouth 3 (three) times daily as needed for muscle spasms. 05/29/20   Jerrol Banana., MD  phentermine (ADIPEX-P) 37.5 MG tablet TAKE 1 TABLET (37.5 MG TOTAL) BY MOUTH DAILY BEFORE BREAKFAST. 05/29/20 11/25/20  Jerrol Banana., MD    Allergies Patient has no known allergies.  Family History  Problem Relation Age of Onset  . Colon polyps Mother   . Hypothyroidism Mother   . CVA Father   . Stroke Brother   . Hypothyroidism Brother     Social History Social History   Tobacco Use  . Smoking status: Never Smoker  . Smokeless tobacco: Never Used  Substance Use Topics  . Alcohol use: No    Alcohol/week: 0.0 standard drinks  . Drug use:  No    Review of Systems  Constitutional: No fever/chills Eyes: No visual changes. ENT: No sore throat. Cardiovascular: Denies chest pain.  Positive for dizziness and lightheadedness. Respiratory: Denies shortness of breath. Gastrointestinal: No abdominal pain.  Positive for nausea and vomiting.  No diarrhea.  No constipation. Genitourinary: Negative for dysuria. Musculoskeletal: Negative for back pain. Skin: Negative for rash. Neurological: Negative for headaches, focal weakness or numbness.  ____________________________________________   PHYSICAL EXAM:  VITAL SIGNS: ED Triage  Vitals  Enc Vitals Group     BP      Pulse      Resp      Temp      Temp src      SpO2      Weight      Height      Head Circumference      Peak Flow      Pain Score      Pain Loc      Pain Edu?      Excl. in [place]?     Constitutional: Alert and oriented. Eyes: Conjunctivae are normal.  Pupils equal round and reactive to light bilaterally. Head: Atraumatic. Nose: No congestion/rhinnorhea. Mouth/Throat: Mucous membranes are moist. Neck: Normal ROM Cardiovascular: Normal rate, regular rhythm. Grossly normal heart sounds.  2+ radial pulses bilaterally. Respiratory: Normal respiratory effort.  No retractions. Lungs CTAB. Gastrointestinal: Soft and nontender. No distention. Genitourinary: deferred Musculoskeletal: No lower extremity tenderness nor edema. Neurologic:  Normal speech and language. No gross focal neurologic deficits are appreciated. Skin:  Skin is warm, dry and intact. No rash noted. Psychiatric: Mood and affect are normal. Speech and behavior are normal.  ____________________________________________   LABS (all labs ordered are listed, but only abnormal results are displayed)  Labs Reviewed  CBC WITH DIFFERENTIAL/PLATELET - Abnormal; Notable for the following components:      Result Value   WBC 11.8 (*)    Monocytes Absolute 1.1 (*)    Abs Immature Granulocytes 0.25 (*)    All other components within normal limits  COMPREHENSIVE METABOLIC PANEL - Abnormal; Notable for the following components:   Potassium 3.4 (*)    CO2 20 (*)    Glucose, Bld 132 (*)    All other components within normal limits  MAGNESIUM  TROPONIN I (HIGH SENSITIVITY)  TROPONIN I (HIGH SENSITIVITY)   ____________________________________________  EKG  ED ECG REPORT I, Blake Divine, the attending physician, personally viewed and interpreted this ECG.   Date: 02/15/2021  EKG Time: 12:45  Rate: 85  Rhythm: normal sinus rhythm  Axis: Normal  Intervals:none  ST&T Change:  None   PROCEDURES  Procedure(s) performed (including Critical Care):  Procedures   ____________________________________________   INITIAL IMPRESSION / ASSESSMENT AND PLAN / ED COURSE       55 year old male with past medical history of GERD presents to the ED complaining of intermittent episodes of dizziness since arriving at work earlier this morning.  This was initially associated with positional changes but eventually became severe with nausea and vomiting.  He denies any associated chest pain or shortness of breath, does not have any focal neurologic deficits on exam.  EKG shows no evidence of arrhythmia or ischemia and initial troponin is negative.  We will observe patient on cardiac monitor and recheck second troponin.  Doubt central vertigo given no focal neurologic deficits and symptoms associated with positional changes.  We will hydrate with IV fluids and give dose of Compazine after he had minimal response to  Zofran.  Patient reports feeling better following dose of Compazine, he has had no further nausea or vomiting and dizziness has resolved.  I doubt central source of vertigo and if repeat troponin is negative, patient will be appropriate for discharge home with PCP follow-up.  Patient turned over to oncoming provider pending repeat troponin.       ____________________________________________   FINAL CLINICAL IMPRESSION(S) / ED DIAGNOSES  Final diagnoses:  Dizziness  Non-intractable vomiting with nausea, unspecified vomiting type     ED Discharge Orders         Ordered    ondansetron (ZOFRAN ODT) 4 MG disintegrating tablet  Every 8 hours PRN        02/15/21 1554           Note:  This document was prepared using Dragon voice recognition software and may include unintentional dictation errors.   Blake Divine, MD 02/15/21 [phone]

## 2021-02-15 NOTE — ED Triage Notes (Signed)
Patient arrives via [facility] [employer] staff after having dizziness, diaphoresis, nausea, vomiting, SOB, and near syncope while working about 1 hour ago. 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 Patient reports hx of same, and reports hyperventilation associated with previous symptom episode. Patient denies chest pain. Patient alert, oriented x4.

## 2021-02-20 DIAGNOSIS — M62830 Muscle spasm of back: Secondary | ICD-10-CM | POA: Diagnosis not present

## 2021-02-20 DIAGNOSIS — M5033 Other cervical disc degeneration, cervicothoracic region: Secondary | ICD-10-CM | POA: Diagnosis not present

## 2021-02-20 DIAGNOSIS — M9901 Segmental and somatic dysfunction of cervical region: Secondary | ICD-10-CM | POA: Diagnosis not present

## 2021-02-20 DIAGNOSIS — M5412 Radiculopathy, cervical region: Secondary | ICD-10-CM | POA: Diagnosis not present

## 2021-02-21 ENCOUNTER — Other Ambulatory Visit: Payer: Self-pay

## 2021-02-21 DIAGNOSIS — R208 Other disturbances of skin sensation: Secondary | ICD-10-CM | POA: Diagnosis not present

## 2021-02-21 DIAGNOSIS — L400 Psoriasis vulgaris: Secondary | ICD-10-CM | POA: Diagnosis not present

## 2021-02-21 DIAGNOSIS — L820 Inflamed seborrheic keratosis: Secondary | ICD-10-CM | POA: Diagnosis not present

## 2021-02-21 DIAGNOSIS — D225 Melanocytic nevi of trunk: Secondary | ICD-10-CM | POA: Diagnosis not present

## 2021-02-21 DIAGNOSIS — D2261 Melanocytic nevi of right upper limb, including shoulder: Secondary | ICD-10-CM | POA: Diagnosis not present

## 2021-02-21 DIAGNOSIS — L821 Other seborrheic keratosis: Secondary | ICD-10-CM | POA: Diagnosis not present

## 2021-02-21 DIAGNOSIS — D2262 Melanocytic nevi of left upper limb, including shoulder: Secondary | ICD-10-CM | POA: Diagnosis not present

## 2021-02-21 DIAGNOSIS — L538 Other specified erythematous conditions: Secondary | ICD-10-CM | POA: Diagnosis not present

## 2021-02-21 MED ORDER — CLOBETASOL PROPIONATE 0.05 % EX CREA
TOPICAL_CREAM | CUTANEOUS | 1 refills | Status: AC
  Filled 2021-02-21: qty 30, 30d supply, fill #0

## 2021-03-05 ENCOUNTER — Other Ambulatory Visit: Payer: Self-pay

## 2021-03-07 DIAGNOSIS — M5412 Radiculopathy, cervical region: Secondary | ICD-10-CM | POA: Diagnosis not present

## 2021-03-07 DIAGNOSIS — M62830 Muscle spasm of back: Secondary | ICD-10-CM | POA: Diagnosis not present

## 2021-03-07 DIAGNOSIS — M9901 Segmental and somatic dysfunction of cervical region: Secondary | ICD-10-CM | POA: Diagnosis not present

## 2021-03-07 DIAGNOSIS — M5033 Other cervical disc degeneration, cervicothoracic region: Secondary | ICD-10-CM | POA: Diagnosis not present

## 2021-03-22 DIAGNOSIS — M62830 Muscle spasm of back: Secondary | ICD-10-CM | POA: Diagnosis not present

## 2021-03-22 DIAGNOSIS — M5412 Radiculopathy, cervical region: Secondary | ICD-10-CM | POA: Diagnosis not present

## 2021-03-22 DIAGNOSIS — M5033 Other cervical disc degeneration, cervicothoracic region: Secondary | ICD-10-CM | POA: Diagnosis not present

## 2021-03-22 DIAGNOSIS — M9901 Segmental and somatic dysfunction of cervical region: Secondary | ICD-10-CM | POA: Diagnosis not present

## 2021-03-29 ENCOUNTER — Encounter: Payer: Self-pay | Admitting: Family Medicine

## 2021-03-29 ENCOUNTER — Other Ambulatory Visit: Payer: Self-pay

## 2021-03-29 ENCOUNTER — Ambulatory Visit (INDEPENDENT_AMBULATORY_CARE_PROVIDER_SITE_OTHER): Payer: 59 | Admitting: Family Medicine

## 2021-03-29 VITALS — Temp 98.0°F | Resp 16 | Wt 242.8 lb

## 2021-03-29 DIAGNOSIS — M436 Torticollis: Secondary | ICD-10-CM

## 2021-03-29 DIAGNOSIS — Z6834 Body mass index (BMI) 34.0-34.9, adult: Secondary | ICD-10-CM

## 2021-03-29 DIAGNOSIS — F411 Generalized anxiety disorder: Secondary | ICD-10-CM

## 2021-03-29 DIAGNOSIS — E6609 Other obesity due to excess calories: Secondary | ICD-10-CM | POA: Diagnosis not present

## 2021-03-29 DIAGNOSIS — I9589 Other hypotension: Secondary | ICD-10-CM

## 2021-03-29 DIAGNOSIS — E861 Hypovolemia: Secondary | ICD-10-CM

## 2021-03-29 MED ORDER — CYCLOBENZAPRINE HCL 10 MG PO TABS
10.0000 mg | ORAL_TABLET | Freq: Three times a day (TID) | ORAL | 5 refills | Status: AC | PRN
  Filled 2021-03-29: qty 30, 10d supply, fill #0
  Filled 2021-06-07: qty 30, 10d supply, fill #1
  Filled 2021-08-21: qty 30, 10d supply, fill #2
  Filled 2022-02-17: qty 30, 10d supply, fill #3

## 2021-03-29 MED ORDER — PHENTERMINE HCL 37.5 MG PO TABS
ORAL_TABLET | Freq: Every day | ORAL | 3 refills | Status: DC
  Filled 2021-03-29: qty 30, 30d supply, fill #0
  Filled 2021-06-07: qty 30, 30d supply, fill #1
  Filled 2021-08-21: qty 30, 30d supply, fill #2

## 2021-03-29 NOTE — Progress Notes (Signed)
Established patient visit   Patient: Ralph Patrick   DOB: [birth year]   55 y.o. Male  MRN: [id number] Visit Date: 03/29/2021  Today's healthcare provider: [name], MD   No chief complaint on file.  Subjective    HPI  This is following up ED visit for dizziness and actual syncope a month ago.  He has had some similar symptoms since then but no frank syncope again.  He also has some perioral and perifacial numbness and carpopedal spasm or these episodes occur consistent with hyperventilation.  GAD 7 today he scores a 13.  Overall he feels fairly well.  His wife is a Equities trader and brings him in today.  She is comfortable that he is doing fairly well. He also mentions that he wants to lose some weight and would like to get back on phentermine which seemed to help him. He request refill on Flexeril for recurrent back strain. Follow up ER visit  Patient was seen in ER for dizziness on 02/15/2021. He was treated for intermittent dizziness and nausea. Treatment for this included hydrate with IV fluids and give dose of Compazine after he had minimal response to Zofran.Marland Kitchen He reports good compliance with treatment. He reports this condition is Improved.  -----------------------------------------------------------------------------------------   Patient Active Problem List   Diagnosis Date Noted   Adiposity 05/23/2019   GERD (gastroesophageal reflux disease) 10/16/2017   Past Medical History:  Diagnosis Date   GERD (gastroesophageal reflux disease)    Hemorrhoid    Lower leg fracture 1969   right lower leg   Thrombosed hemorrhoids 08-25-08 and 10-02-09   Thumb fracture    No Known Allergies     Medications: Outpatient Medications Prior to Visit  Medication Sig   clobetasol cream (TEMOVATE) 0.05 % Apply to psoriasis on elbows  twice daily until clear   cyclobenzaprine (FLEXERIL) 10 MG tablet Take 1 tablet (10 mg total) by mouth 3 (three) times daily as needed  for muscle spasms.   omeprazole (PRILOSEC) 40 MG capsule Take 1 capsule (40 mg total) by mouth daily.   ondansetron (ZOFRAN ODT) 4 MG disintegrating tablet Take 1 tablet (4 mg total) by mouth every 8 (eight) hours as needed for nausea or vomiting.   phentermine (ADIPEX-P) 37.5 MG tablet TAKE 1 TABLET (37.5 MG TOTAL) BY MOUTH DAILY BEFORE BREAKFAST.   No facility-administered medications prior to visit.    Review of Systems      Objective    Temp 98 F (36.7 C) (Oral)   Resp 16   Wt 242 lb 12.8 oz (110.1 kg)   SpO2 95%   BMI 32.93 kg/m  BP Readings from Last 3 Encounters:  02/15/21 112/69  12/27/20 103/69  05/29/20 97/64   Wt Readings from Last 3 Encounters:  03/29/21 242 lb 12.8 oz (110.1 kg)  02/15/21 247 lb (112 kg)  12/27/20 239 lb (108.4 kg)     Orthostatic VS for the past 72 hrs (Last 3 readings):  Orthostatic BP Patient Position BP Location Cuff Size Orthostatic Pulse  03/29/21 1536 104/77 Standing Right Arm Large 77  03/29/21 1533 97/77 Sitting Right Arm Large 71  03/29/21 1530 101/76 Supine Right Arm Large 66    Physical Exam Vitals reviewed.  Constitutional:      Appearance: Normal appearance. He is normal weight.  HENT:     Head: Normocephalic and atraumatic.     Right Ear: Tympanic membrane normal.     Left Ear: Tympanic  membrane normal.     Nose: Nose normal.     Mouth/Throat:     Mouth: Mucous membranes are moist.     Pharynx: Oropharynx is clear.  Eyes:     Extraocular Movements: Extraocular movements intact.     Conjunctiva/sclera: Conjunctivae normal.     Pupils: Pupils are equal, round, and reactive to light.  Cardiovascular:     Rate and Rhythm: Normal rate and regular rhythm.     Pulses: Normal pulses.     Heart sounds: Normal heart sounds.  Pulmonary:     Effort: Pulmonary effort is normal.  Abdominal:     Palpations: Abdomen is soft.  Musculoskeletal:     Cervical back: Normal range of motion and neck supple.     Comments: Mild  decreased range of motion in the neck with negative Spurling's maneuver  Skin:    General: Skin is warm and dry.  Neurological:     General: No focal deficit present.     Mental Status: He is alert and oriented to person, place, and time.  Psychiatric:        Mood and Affect: Mood normal.        Behavior: Behavior normal.        Thought Content: Thought content normal.        Judgment: Judgment normal.      No results found for any visits on 03/29/21.  Assessment & Plan     1. GAD (generalized anxiety disorder) Mild chronic anxiety which the patient seems to have under control.  Consider counseling or SSRI in the future if this becomes an issue  2. Hypotension due to hypovolemia I think the patient's ED visit was underlying hypotension vasovagal syncopal type episode.  This recurs would refer to cardiology but I do not think any further work-up necessary at this time.  Patient and wife both comfortable with this. More than 50% 25 minute visit spent in counseling or coordination of care. blood pressures in the low but patient not orthostatic today  Follow blood pressures, I do not think he needs Florinef at this time. 3. Class 1 obesity due to excess calories with serious comorbidity and body mass index (BMI) of 34.0 to 34.9 in adult Patient wishes to try phentermine again.  Follow-up 3 to 4 months to assess weight - phentermine (ADIPEX-P) 37.5 MG tablet; TAKE 1 TABLET (37.5 MG TOTAL) BY MOUTH DAILY BEFORE BREAKFAST.  Dispense: 30 tablet; Refill: 3  4. Torticollis Refill cyclobenzaprine to use as needed - cyclobenzaprine (FLEXERIL) 10 MG tablet; Take 1 tablet (10 mg total) by mouth 3 (three) times daily as needed for muscle spasms.  Dispense: 30 tablet; Refill: 5    No follow-ups on file.      I, [name], MD, have reviewed all documentation for this visit. The documentation on 04/05/21 for the exam, diagnosis, procedures, and orders are all accurate and  complete.    Rahcel Shutes Cranford Mon, MD  [facility] [phone] (phone) [phone] (fax)  [place]

## 2021-04-05 DIAGNOSIS — M9901 Segmental and somatic dysfunction of cervical region: Secondary | ICD-10-CM | POA: Diagnosis not present

## 2021-04-05 DIAGNOSIS — M5412 Radiculopathy, cervical region: Secondary | ICD-10-CM | POA: Diagnosis not present

## 2021-04-05 DIAGNOSIS — M5033 Other cervical disc degeneration, cervicothoracic region: Secondary | ICD-10-CM | POA: Diagnosis not present

## 2021-04-05 DIAGNOSIS — M62830 Muscle spasm of back: Secondary | ICD-10-CM | POA: Diagnosis not present

## 2021-04-18 DIAGNOSIS — M9901 Segmental and somatic dysfunction of cervical region: Secondary | ICD-10-CM | POA: Diagnosis not present

## 2021-04-18 DIAGNOSIS — M62830 Muscle spasm of back: Secondary | ICD-10-CM | POA: Diagnosis not present

## 2021-04-18 DIAGNOSIS — M5033 Other cervical disc degeneration, cervicothoracic region: Secondary | ICD-10-CM | POA: Diagnosis not present

## 2021-04-18 DIAGNOSIS — M5412 Radiculopathy, cervical region: Secondary | ICD-10-CM | POA: Diagnosis not present

## 2021-05-02 DIAGNOSIS — M9901 Segmental and somatic dysfunction of cervical region: Secondary | ICD-10-CM | POA: Diagnosis not present

## 2021-05-02 DIAGNOSIS — M5033 Other cervical disc degeneration, cervicothoracic region: Secondary | ICD-10-CM | POA: Diagnosis not present

## 2021-05-02 DIAGNOSIS — M5412 Radiculopathy, cervical region: Secondary | ICD-10-CM | POA: Diagnosis not present

## 2021-05-02 DIAGNOSIS — M62830 Muscle spasm of back: Secondary | ICD-10-CM | POA: Diagnosis not present

## 2021-05-16 DIAGNOSIS — M5412 Radiculopathy, cervical region: Secondary | ICD-10-CM | POA: Diagnosis not present

## 2021-05-16 DIAGNOSIS — M9901 Segmental and somatic dysfunction of cervical region: Secondary | ICD-10-CM | POA: Diagnosis not present

## 2021-05-16 DIAGNOSIS — M5033 Other cervical disc degeneration, cervicothoracic region: Secondary | ICD-10-CM | POA: Diagnosis not present

## 2021-05-16 DIAGNOSIS — M62830 Muscle spasm of back: Secondary | ICD-10-CM | POA: Diagnosis not present

## 2021-05-30 DIAGNOSIS — M62830 Muscle spasm of back: Secondary | ICD-10-CM | POA: Diagnosis not present

## 2021-05-30 DIAGNOSIS — M5412 Radiculopathy, cervical region: Secondary | ICD-10-CM | POA: Diagnosis not present

## 2021-05-30 DIAGNOSIS — M5033 Other cervical disc degeneration, cervicothoracic region: Secondary | ICD-10-CM | POA: Diagnosis not present

## 2021-05-30 DIAGNOSIS — M9901 Segmental and somatic dysfunction of cervical region: Secondary | ICD-10-CM | POA: Diagnosis not present

## 2021-06-08 ENCOUNTER — Other Ambulatory Visit: Payer: Self-pay

## 2021-06-13 DIAGNOSIS — M9901 Segmental and somatic dysfunction of cervical region: Secondary | ICD-10-CM | POA: Diagnosis not present

## 2021-06-13 DIAGNOSIS — M62830 Muscle spasm of back: Secondary | ICD-10-CM | POA: Diagnosis not present

## 2021-06-13 DIAGNOSIS — M5412 Radiculopathy, cervical region: Secondary | ICD-10-CM | POA: Diagnosis not present

## 2021-06-13 DIAGNOSIS — M5033 Other cervical disc degeneration, cervicothoracic region: Secondary | ICD-10-CM | POA: Diagnosis not present

## 2021-06-27 DIAGNOSIS — M62830 Muscle spasm of back: Secondary | ICD-10-CM | POA: Diagnosis not present

## 2021-06-27 DIAGNOSIS — M5033 Other cervical disc degeneration, cervicothoracic region: Secondary | ICD-10-CM | POA: Diagnosis not present

## 2021-06-27 DIAGNOSIS — M9901 Segmental and somatic dysfunction of cervical region: Secondary | ICD-10-CM | POA: Diagnosis not present

## 2021-06-27 DIAGNOSIS — M5412 Radiculopathy, cervical region: Secondary | ICD-10-CM | POA: Diagnosis not present

## 2021-07-02 ENCOUNTER — Ambulatory Visit (INDEPENDENT_AMBULATORY_CARE_PROVIDER_SITE_OTHER): Payer: 59 | Admitting: Family Medicine

## 2021-07-02 ENCOUNTER — Other Ambulatory Visit: Payer: Self-pay

## 2021-07-02 ENCOUNTER — Encounter: Payer: Self-pay | Admitting: Family Medicine

## 2021-07-02 VITALS — BP 101/75 | HR 77 | Temp 98.8°F | Resp 16 | Ht 72.0 in | Wt 235.0 lb

## 2021-07-02 DIAGNOSIS — B880 Other acariasis: Secondary | ICD-10-CM | POA: Diagnosis not present

## 2021-07-02 DIAGNOSIS — Z6831 Body mass index (BMI) 31.0-31.9, adult: Secondary | ICD-10-CM

## 2021-07-02 DIAGNOSIS — K219 Gastro-esophageal reflux disease without esophagitis: Secondary | ICD-10-CM | POA: Diagnosis not present

## 2021-07-02 DIAGNOSIS — E6609 Other obesity due to excess calories: Secondary | ICD-10-CM | POA: Diagnosis not present

## 2021-07-02 NOTE — Progress Notes (Signed)
Established patient visit   Patient: Ralph Patrick   DOB: [birth year]   55 y.o. Male  MRN: [id number] Visit Date: 07/02/2021  Today's healthcare provider: [name], MD   Chief Complaint  Patient presents with   Follow-up   Subjective    HPI  Patient is feeling well and has no complaints.  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  He hopes to make this a full-time career. Anxiety, Follow-up  He was last seen for anxiety 3 months ago. Changes made at last visit include no medication changes. Continue to monitor.    He reports good compliance with treatment. He reports good tolerance of treatment. He is not having side effects.   He feels his anxiety is mild and Unchanged since last visit.  Symptoms: No chest pain No difficulty concentrating  No dizziness No fatigue  No feelings of losing control No insomnia  No irritable No palpitations  No panic attacks No racing thoughts  No shortness of breath No sweating  No tremors/shakes     PHQ-9 Scores PHQ9 SCORE ONLY 07/02/2021 12/27/2020 11/30/2019  PHQ-9 Total Score 4 8 0       Medications: Outpatient Medications Prior to Visit  Medication Sig   clobetasol cream (TEMOVATE) 0.05 % Apply to psoriasis on elbows  twice daily until clear   cyclobenzaprine (FLEXERIL) 10 MG tablet Take 1 tablet (10 mg total) by mouth 3 (three) times daily as needed for muscle spasms.   omeprazole (PRILOSEC) 40 MG capsule Take 1 capsule (40 mg total) by mouth daily.   ondansetron (ZOFRAN ODT) 4 MG disintegrating tablet Take 1 tablet (4 mg total) by mouth every 8 (eight) hours as needed for nausea or vomiting.   phentermine (ADIPEX-P) 37.5 MG tablet TAKE 1 TABLET (37.5 MG TOTAL) BY MOUTH DAILY BEFORE BREAKFAST.   No facility-administered medications prior to visit.    Review of Systems  Constitutional:  Negative for activity change and fatigue.  Respiratory:  Negative for cough and shortness of  breath.   Cardiovascular:  Negative for chest pain, palpitations and leg swelling.  Musculoskeletal:  Negative for arthralgias and myalgias.  Neurological:  Negative for dizziness, light-headedness and headaches.  Psychiatric/Behavioral:  Negative for agitation. The patient is not nervous/anxious.        Objective    BP 101/75   Pulse 77   Temp 98.8 F (37.1 C)   Resp 16   Ht 6' (1.829 m)   Wt 235 lb (106.6 kg)   BMI 31.87 kg/m  BP Readings from Last 3 Encounters:  07/02/21 101/75  02/15/21 112/69  12/27/20 103/69   Wt Readings from Last 3 Encounters:  07/02/21 235 lb (106.6 kg)  03/29/21 242 lb 12.8 oz (110.1 kg)  02/15/21 247 lb (112 kg)      Physical Exam Vitals reviewed.  Constitutional:      Appearance: Normal appearance. He is normal weight.  HENT:     Head: Normocephalic and atraumatic.     Right Ear: Tympanic membrane normal.     Left Ear: Tympanic membrane normal.     Nose: Nose normal.     Mouth/Throat:     Mouth: Mucous membranes are moist.     Pharynx: Oropharynx is clear.  Eyes:     Extraocular Movements: Extraocular movements intact.     Conjunctiva/sclera: Conjunctivae normal.     Pupils: Pupils are equal, round, and reactive to light.  Cardiovascular:     Rate and Rhythm: Normal rate and regular rhythm.     Pulses: Normal pulses.     Heart sounds: Normal heart sounds.  Pulmonary:     Effort: Pulmonary effort is normal.  Abdominal:     Palpations: Abdomen is soft.  Musculoskeletal:     Cervical back: Normal range of motion and neck supple.  Skin:    General: Skin is warm and dry.  Neurological:     General: No focal deficit present.     Mental Status: He is alert and oriented to person, place, and time.  Psychiatric:        Mood and Affect: Mood normal.        Behavior: Behavior normal.        Thought Content: Thought content normal.        Judgment: Judgment normal.      No results found for any visits on 07/02/21.  Assessment &  Plan     1. Class 1 obesity due to excess calories without serious comorbidity with body mass index (BMI) of 31.0 to 31.9 in adult Continue to work on diet and exercise.  Patient is down 8 pounds. I will see him back next summer for physical  2. Gastroesophageal reflux disease, unspecified whether esophagitis present Continue pantoprazole as needed  3. Chiggers Improving with topical treatment   No follow-ups on file.      I, [name], MD, have reviewed all documentation for this visit. The documentation on 07/05/21 for the exam, diagnosis, procedures, and orders are all accurate and complete.    [name] Mon, MD  [facility] [phone] (phone) [phone] (fax)  [place]

## 2021-07-11 DIAGNOSIS — M62830 Muscle spasm of back: Secondary | ICD-10-CM | POA: Diagnosis not present

## 2021-07-11 DIAGNOSIS — M5412 Radiculopathy, cervical region: Secondary | ICD-10-CM | POA: Diagnosis not present

## 2021-07-11 DIAGNOSIS — M9901 Segmental and somatic dysfunction of cervical region: Secondary | ICD-10-CM | POA: Diagnosis not present

## 2021-07-11 DIAGNOSIS — M5033 Other cervical disc degeneration, cervicothoracic region: Secondary | ICD-10-CM | POA: Diagnosis not present

## 2021-07-30 DIAGNOSIS — M9901 Segmental and somatic dysfunction of cervical region: Secondary | ICD-10-CM | POA: Diagnosis not present

## 2021-07-30 DIAGNOSIS — M5412 Radiculopathy, cervical region: Secondary | ICD-10-CM | POA: Diagnosis not present

## 2021-07-30 DIAGNOSIS — M5033 Other cervical disc degeneration, cervicothoracic region: Secondary | ICD-10-CM | POA: Diagnosis not present

## 2021-07-30 DIAGNOSIS — M62830 Muscle spasm of back: Secondary | ICD-10-CM | POA: Diagnosis not present

## 2021-08-01 ENCOUNTER — Encounter: Payer: Self-pay | Admitting: Family Medicine

## 2021-08-01 ENCOUNTER — Ambulatory Visit
Admission: RE | Admit: 2021-08-01 | Discharge: 2021-08-01 | Disposition: A | Discharge status: Home / Self Care | Payer: 59 | Attending: Family Medicine | Admitting: Family Medicine

## 2021-08-01 ENCOUNTER — Ambulatory Visit
Admission: RE | Admit: 2021-08-01 | Discharge: 2021-08-01 | Disposition: A | Discharge status: Home / Self Care | Payer: 59 | Source: Ambulatory Visit | Attending: Family Medicine | Admitting: Family Medicine

## 2021-08-01 ENCOUNTER — Ambulatory Visit (INDEPENDENT_AMBULATORY_CARE_PROVIDER_SITE_OTHER): Payer: 59 | Admitting: Family Medicine

## 2021-08-01 ENCOUNTER — Other Ambulatory Visit: Payer: Self-pay

## 2021-08-01 DIAGNOSIS — Y929 Unspecified place or not applicable: Secondary | ICD-10-CM | POA: Diagnosis not present

## 2021-08-01 DIAGNOSIS — Y939 Activity, unspecified: Secondary | ICD-10-CM | POA: Diagnosis not present

## 2021-08-01 DIAGNOSIS — M542 Cervicalgia: Secondary | ICD-10-CM | POA: Diagnosis not present

## 2021-08-01 DIAGNOSIS — M546 Pain in thoracic spine: Secondary | ICD-10-CM | POA: Diagnosis not present
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
## 2021-08-15 DIAGNOSIS — M5033 Other cervical disc degeneration, cervicothoracic region: Secondary | ICD-10-CM | POA: Diagnosis not present

## 2021-08-15 DIAGNOSIS — M9901 Segmental and somatic dysfunction of cervical region: Secondary | ICD-10-CM | POA: Diagnosis not present

## 2021-08-15 DIAGNOSIS — M62830 Muscle spasm of back: Secondary | ICD-10-CM | POA: Diagnosis not present

## 2021-08-15 DIAGNOSIS — M5412 Radiculopathy, cervical region: Secondary | ICD-10-CM | POA: Diagnosis not present

## 2021-08-22 ENCOUNTER — Other Ambulatory Visit: Payer: Self-pay

## 2021-09-03 DIAGNOSIS — M5033 Other cervical disc degeneration, cervicothoracic region: Secondary | ICD-10-CM | POA: Diagnosis not present

## 2021-09-03 DIAGNOSIS — M62830 Muscle spasm of back: Secondary | ICD-10-CM | POA: Diagnosis not present

## 2021-09-03 DIAGNOSIS — M5412 Radiculopathy, cervical region: Secondary | ICD-10-CM | POA: Diagnosis not present

## 2021-09-03 DIAGNOSIS — M9901 Segmental and somatic dysfunction of cervical region: Secondary | ICD-10-CM | POA: Diagnosis not present

## 2021-11-23 ENCOUNTER — Other Ambulatory Visit: Payer: Self-pay

## 2021-11-23 MED ORDER — FLUTICASONE PROPIONATE 50 MCG/ACT NA SUSP
NASAL | 0 refills | Status: AC
  Filled 2021-11-23: qty 16, 30d supply, fill #0

## 2021-11-23 MED ORDER — CEPACOL SORE THROAT & COUGH 5-7.5 MG MT LOZG: LOZENGE | OROMUCOSAL | 0 refills | Status: DC

## 2021-11-23 MED ORDER — IPRATROPIUM BROMIDE 0.03 % NA SOLN
NASAL | 0 refills | Status: AC
  Filled 2021-11-23: qty 30, 30d supply, fill #0

## 2021-11-23 MED ORDER — LEVOCETIRIZINE DIHYDROCHLORIDE 5 MG PO TABS
ORAL_TABLET | ORAL | 0 refills | Status: AC
  Filled 2021-11-23: qty 30, 30d supply, fill #0

## 2021-11-23 MED ORDER — ALBUTEROL SULFATE HFA 108 (90 BASE) MCG/ACT IN AERS
INHALATION_SPRAY | RESPIRATORY_TRACT | 0 refills | Status: DC
  Filled 2021-11-23: qty 18, 25d supply, fill #0

## 2022-01-01 ENCOUNTER — Ambulatory Visit (INDEPENDENT_AMBULATORY_CARE_PROVIDER_SITE_OTHER): Payer: 59 | Admitting: Family Medicine

## 2022-01-01 ENCOUNTER — Other Ambulatory Visit: Payer: Self-pay

## 2022-01-01 ENCOUNTER — Encounter: Payer: Self-pay | Admitting: Family Medicine

## 2022-01-01 VITALS — BP 100/67 | HR 71 | Temp 98.0°F | Resp 14 | Ht 72.0 in | Wt 236.0 lb

## 2022-01-01 DIAGNOSIS — Z125 Encounter for screening for malignant neoplasm of prostate: Secondary | ICD-10-CM

## 2022-01-01 DIAGNOSIS — L570 Actinic keratosis: Secondary | ICD-10-CM

## 2022-01-01 DIAGNOSIS — E66811 Obesity, class 1: Secondary | ICD-10-CM

## 2022-01-01 DIAGNOSIS — Z0000 Encounter for general adult medical examination without abnormal findings: Secondary | ICD-10-CM | POA: Diagnosis not present

## 2022-01-01 DIAGNOSIS — Z6834 Body mass index (BMI) 34.0-34.9, adult: Secondary | ICD-10-CM

## 2022-01-01 DIAGNOSIS — Z1389 Encounter for screening for other disorder: Secondary | ICD-10-CM | POA: Diagnosis not present

## 2022-01-01 DIAGNOSIS — K219 Gastro-esophageal reflux disease without esophagitis: Secondary | ICD-10-CM

## 2022-01-01 DIAGNOSIS — E6609 Other obesity due to excess calories: Secondary | ICD-10-CM

## 2022-01-01 DIAGNOSIS — Z6831 Body mass index (BMI) 31.0-31.9, adult: Secondary | ICD-10-CM

## 2022-01-01 LAB — POCT URINALYSIS DIPSTICK
Bilirubin, UA: NEGATIVE
Blood, UA: NEGATIVE
Glucose, UA: NEGATIVE
Ketones, UA: NEGATIVE
Leukocytes, UA: NEGATIVE
Nitrite, UA: NEGATIVE
Protein, UA: NEGATIVE
Spec Grav, UA: 1.025 (ref 1.010–1.025)
Urobilinogen, UA: 0.2 E.U./dL
pH, UA: 6 (ref 5.0–8.0)

## 2022-01-01 MED ORDER — PHENTERMINE HCL 37.5 MG PO TABS
ORAL_TABLET | Freq: Every day | ORAL | 4 refills | Status: DC
  Filled 2022-01-01: qty 30, 30d supply, fill #0
  Filled 2022-06-16: qty 30, 30d supply, fill #1

## 2022-01-01 NOTE — Progress Notes (Signed)
? ? ? ?Complete physical exam ? ?I,Ralph Patrick,acting as a scribe for Ralph Durie, MD.,have documented all relevant documentation on the behalf of Ralph Durie, MD,as directed by  Ralph Durie, MD while in the presence of Ralph Durie, MD.. ? ? ?Patient: Ralph Patrick   DOB: [birth year]   56 y.o. Male  MRN: [id number] ?Visit Date: 01/01/2022 ? ?Today's healthcare provider: [name], MD  ? ?Chief Complaint  ?Patient presents with  ? Annual Exam  ? ?Subjective  ?  ?Ralph Patrick is a 56 y.o. male who presents today for a complete physical exam.  ?He reports consuming a general diet. Home exercise routine includes walking. He generally feels fairly well. He reports sleeping fairly well. He does not have additional problems to discuss today.  ?HPI  ?Patient is married and has a 42 year old daughter who and a 82-year-old grandson to the daughter and a 67 year old son.  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 ? ?Past Medical History:  ?Diagnosis Date  ? GERD (gastroesophageal reflux disease)   ? Hemorrhoid   ? Lower leg fracture 1969  ? right lower leg  ? Thrombosed hemorrhoids 08-25-08 and 10-02-09  ? Thumb fracture   ? ?Past Surgical History:  ?Procedure Laterality Date  ? ABSCESS DRAINAGE  09-15-92  ? perianal  ? INCISION AND DRAINAGE  2009, 2010  ? hemorrhoids  ? TONSILLECTOMY  1972  ? VASECTOMY    ? ?Social History  ? ?Socioeconomic History  ? Marital status: Married  ?  Spouse name: Not on file  ? Number of children: Not on file  ? Years of education: Not on file  ? Highest education level: Not on file  ?Occupational History  ? Not on file  ?Tobacco Use  ? Smoking status: Never  ? Smokeless tobacco: Never  ?Substance and Sexual Activity  ? Alcohol use: No  ?  Alcohol/week: 0.0 standard drinks  ? Drug use: No  ? Sexual activity: Not on file  ?Other Topics Concern  ? Not on file  ?Social History Narrative  ? Not on file  ? ?Social Determinants of Health   ? ?Financial Resource Strain: Not on file  ?Food Insecurity: Not on file  ?Transportation Needs: Not on file  ?Physical Activity: Not on file  ?Stress: Not on file  ?Social Connections: Not on file  ?Intimate Partner Violence: Not on file  ? ?Family Status  ?Relation Name Status  ? Mother  Alive  ? Father  Alive  ? Brother  Alive  ? Brother  Alive  ? ?Family History  ?Problem Relation Age of Onset  ? Colon polyps Mother   ? Hypothyroidism Mother   ? CVA Father   ? Stroke Brother   ? Hypothyroidism Brother   ? ?No Known Allergies  ?Patient Care Team: ?Jerrol Banana., MD as PCP - General (Family Medicine) ?Christene Lye, MD (General Surgery)  ? ?Medications: ?Outpatient Medications Prior to Visit  ?Medication Sig  ? clobetasol cream (TEMOVATE) 0.05 % Apply to psoriasis on elbows  twice daily until clear  ? cyclobenzaprine (FLEXERIL) 10 MG tablet Take 1 tablet (10 mg total) by mouth 3 (three) times daily as needed for muscle spasms.  ? Dextromethorphan-Benzocaine (CEPACOL SORE THROAT & COUGH) 5-7.5 MG LOZG Take 2 loz orally every 4 hours as needed for sore throat or cough  ? fluticasone (FLONASE) 50 MCG/ACT nasal spray Take 1 spray(s) intranasally  once a day  ? ipratropium (ATROVENT) 0.03 % nasal spray Take 2 spray(s) intranasally 3 times a day  ? levocetirizine (XYZAL ALLERGY 24HR) 5 MG tablet Take 1 tab(s) orally once a day (in the evening)  ? omeprazole (PRILOSEC) 40 MG capsule Take 1 capsule (40 mg total) by mouth daily.  ? phentermine (ADIPEX-P) 37.5 MG tablet TAKE 1 TABLET (37.5 MG TOTAL) BY MOUTH DAILY BEFORE BREAKFAST.  ? [DISCONTINUED] albuterol (VENTOLIN HFA) 108 (90 Base) MCG/ACT inhaler Take 2 puff(s) inhaled 4 times a day as needed for wheezing  ? ?No facility-administered medications prior to visit.  ? ? ?Review of Systems  ?All other systems reviewed and are negative. ? ?Last lipids ?Lab Results  ?Component Value Date  ? CHOL 251 (H) 06/02/2020  ? HDL 52 06/02/2020  ? LDLCALC 177 (H)  06/02/2020  ? TRIG 109 06/02/2020  ? CHOLHDL 4.8 06/02/2020  ? ?  ? Objective  ?  ?BP 100/67 (BP Location: Right Arm, Patient Position: Sitting, Cuff Size: Large)   Pulse 71   Temp 98 ?F (36.7 ?C) (Temporal)   Resp 14   Ht 6' (1.829 m)   Wt 236 lb (107 kg)   SpO2 97%   BMI 32.01 kg/m?  ?BP Readings from Last 3 Encounters:  ?01/01/22 100/67  ?08/01/21 105/71  ?07/02/21 101/75  ? ?Wt Readings from Last 3 Encounters:  ?01/01/22 236 lb (107 kg)  ?08/01/21 236 lb (107 kg)  ?07/02/21 235 lb (106.6 kg)  ? ?  ? ? ?Physical Exam ?Vitals reviewed.  ?Constitutional:   ?   Appearance: Normal appearance. He is normal weight.  ?HENT:  ?   Head: Normocephalic and atraumatic.  ?   Right Ear: Tympanic membrane normal.  ?   Left Ear: Tympanic membrane normal.  ?   Nose: Nose normal.  ?   Mouth/Throat:  ?   Mouth: Mucous membranes are moist.  ?   Pharynx: Oropharynx is clear.  ?Eyes:  ?   Extraocular Movements: Extraocular movements intact.  ?   Conjunctiva/sclera: Conjunctivae normal.  ?   Pupils: Pupils are equal, round, and reactive to light.  ?Cardiovascular:  ?   Rate and Rhythm: Normal rate and regular rhythm.  ?   Pulses: Normal pulses.  ?   Heart sounds: Normal heart sounds.  ?Pulmonary:  ?   Effort: Pulmonary effort is normal.  ?   Breath sounds: Normal breath sounds.  ?Abdominal:  ?   General: Bowel sounds are normal.  ?   Palpations: Abdomen is soft.  ?Genitourinary: ?   Penis: Normal.   ?   Testes: Normal.  ?Musculoskeletal:     ?   General: Normal range of motion.  ?   Cervical back: Normal range of motion and neck supple.  ?Skin: ?   General: Skin is warm and dry.  ?Neurological:  ?   General: No focal deficit present.  ?   Mental Status: He is alert and oriented to person, place, and time. Mental status is at baseline.  ?Psychiatric:     ?   Mood and Affect: Mood normal.     ?   Behavior: Behavior normal.     ?   Thought Content: Thought content normal.     ?   Judgment: Judgment normal.  ?  ? ? ?Last depression  screening scores ?PHQ 2/9 Scores 01/01/2022 07/02/2021 12/27/2020  ?PHQ - 2 Score 0 0 2  ?PHQ- 9 Score '3 4 8  '$ ? ?Last  fall risk screening ?Fall Risk  01/01/2022  ?Falls in the past year? 0  ?Number falls in past yr: 0  ?Injury with Fall? 0  ?Risk for fall due to : No Fall Risks  ?Follow up Falls evaluation completed  ? ?Last Audit-C alcohol use screening ?Alcohol Use Disorder Test (AUDIT) 01/01/2022  ?1. How often do you have a drink containing alcohol? 2  ?2. How many drinks containing alcohol do you have on a typical day when you are drinking? 0  ?3. How often do you have six or more drinks on one occasion? 0  ?AUDIT-C Score 2  ?Alcohol Brief Interventions/Follow-up -  ? ?A score of 3 or more in women, and 4 or more in men indicates increased risk for alcohol abuse, EXCEPT if all of the points are from question 1  ? ?Results for orders placed or performed in visit on 01/01/22  ?POCT urinalysis dipstick  ?Result Value Ref Range  ? Color, UA Yellow   ? Clarity, UA Clear   ? Glucose, UA Negative Negative  ? Bilirubin, UA Negative   ? Ketones, UA Negative   ? Spec Grav, UA 1.025 1.010 - 1.025  ? Blood, UA Negative   ? pH, UA 6.0 5.0 - 8.0  ? Protein, UA Negative Negative  ? Urobilinogen, UA 0.2 0.2 or 1.0 E.U./dL  ? Nitrite, UA Negative   ? Leukocytes, UA Negative Negative  ? ? Assessment & Plan  ?  ?Routine Health Maintenance and Physical Exam ? ?Exercise Activities and Dietary recommendations ? Goals   ?None ?  ? ? ?Immunization History  ?Administered Date(s) Administered  ? Influenza Whole 06/07/2017  ? Influenza-Unspecified 07/10/2019  ? ? ?Health Maintenance  ?Topic Date Due  ? HIV Screening  Never done  ? Hepatitis C Screening  Never done  ? INFLUENZA VACCINE  01/02/2023 (Originally 05/14/2021)  ? TETANUS/TDAP  01/02/2023 (Originally 10/13/1984)  ? COVID-19 Vaccine (1) 01/02/2023 (Originally 04/12/1966)  ? Zoster Vaccines- Shingrix (1 of 2) 01/02/2023 (Originally 10/14/2015)  ? Fecal DNA (Cologuard)  02/09/2024  ? HPV  VACCINES  Aged Out  ? ? ?Discussed health benefits of physical activity, and encouraged him to engage in regular exercise appropriate for his age and condition. ? ?1. Annual physical exam ? ?- Lipid panel ?- TSH ?

## 2022-02-18 ENCOUNTER — Other Ambulatory Visit: Payer: Self-pay

## 2022-03-26 DIAGNOSIS — H5213 Myopia, bilateral: Secondary | ICD-10-CM | POA: Diagnosis not present

## 2022-05-17 ENCOUNTER — Other Ambulatory Visit (HOSPITAL_COMMUNITY): Payer: Self-pay

## 2022-06-17 ENCOUNTER — Other Ambulatory Visit: Payer: Self-pay

## 2022-06-18 ENCOUNTER — Other Ambulatory Visit: Payer: Self-pay

## 2022-07-01 ENCOUNTER — Ambulatory Visit (INDEPENDENT_AMBULATORY_CARE_PROVIDER_SITE_OTHER): Payer: 59 | Admitting: Family Medicine

## 2022-07-01 ENCOUNTER — Encounter: Payer: Self-pay | Admitting: Family Medicine

## 2022-07-01 VITALS — BP 118/80 | HR 75 | Resp 14 | Ht 72.0 in | Wt 241.0 lb

## 2022-07-01 DIAGNOSIS — K219 Gastro-esophageal reflux disease without esophagitis: Secondary | ICD-10-CM

## 2022-07-01 DIAGNOSIS — E6609 Other obesity due to excess calories: Secondary | ICD-10-CM

## 2022-07-01 DIAGNOSIS — Z125 Encounter for screening for malignant neoplasm of prostate: Secondary | ICD-10-CM | POA: Diagnosis not present

## 2022-07-01 DIAGNOSIS — Z6831 Body mass index (BMI) 31.0-31.9, adult: Secondary | ICD-10-CM

## 2022-07-01 DIAGNOSIS — F411 Generalized anxiety disorder: Secondary | ICD-10-CM

## 2022-07-01 DIAGNOSIS — Z6834 Body mass index (BMI) 34.0-34.9, adult: Secondary | ICD-10-CM

## 2022-07-01 NOTE — Progress Notes (Signed)
Established patient visit  I,[name],acting as a scribe for Ralph Durie, MD.,have documented all relevant documentation on the behalf of Ralph Durie, MD,as directed by  Ralph Durie, MD while in the presence of Ralph Durie, MD.   Patient: Ralph Patrick   DOB: [birth year]   56 y.o. 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  Prilosec on a as needed basis and would like to consider getting back on her main to try to lose weight.  Follow up for Class 1 obesity due to excess calories with serious comorbidity and body mass index (BMI) of 34.0 to 34.9 in adult:  The patient was last seen for this 6 months ago. Changes made at last visit include; Patient has done well with phentermine 37.5 every morning.  Follow-up in September and hopefully stop at that time.Marland Kitchen  -----------------------------------------------------------------------------------------   Medications: Outpatient Medications Prior to Visit  Medication Sig   clobetasol cream (TEMOVATE) 0.05 % Apply to psoriasis on elbows  twice daily until clear   cyclobenzaprine (FLEXERIL) 10 MG tablet Take 1 tablet (10 mg total) by mouth 3 (three) times daily as needed for muscle spasms.   fluticasone (FLONASE) 50 MCG/ACT nasal spray Take 1 spray(s) intranasally once a day   ipratropium (ATROVENT) 0.03 % nasal spray Take 2 spray(s) intranasally 3 times a day   levocetirizine (XYZAL ALLERGY 24HR) 5 MG tablet Take 1 tab(s) orally once a day (in the evening)   omeprazole (PRILOSEC) 40 MG capsule Take 1 capsule (40 mg total) by mouth daily.   phentermine (ADIPEX-P) 37.5 MG tablet TAKE 1 TABLET (37.5 MG TOTAL) BY MOUTH DAILY BEFORE BREAKFAST.   [DISCONTINUED] Dextromethorphan-Benzocaine (CEPACOL SORE  THROAT & COUGH) 5-7.5 MG LOZG Take 2 loz orally every 4 hours as needed for sore throat or cough (Patient not taking: Reported on 07/01/2022)   No facility-administered medications prior to visit.    Review of Systems  Constitutional:  Negative for appetite change, chills and fever.  Respiratory:  Negative for chest tightness, shortness of breath and wheezing.   Cardiovascular:  Negative for chest pain and palpitations.  Gastrointestinal:  Negative for abdominal pain, nausea and vomiting.    Last hemoglobin A1c No results found for: "HGBA1C"     Objective    BP 118/80 (BP Location: Right Arm, Patient Position: Sitting, Cuff Size: Large)   Pulse 75   Resp 14   Ht 6' (1.829 m)   Wt 241 lb (109.3 kg)   SpO2 97%   BMI 32.69 kg/m  BP Readings from Last 3 Encounters:  07/01/22 118/80  01/01/22 100/67  08/01/21 105/71   Wt Readings from Last 3 Encounters:  07/01/22 241 lb (109.3 kg)  01/01/22 236 lb (107 kg)  08/01/21 236 lb (107 kg)      Physical Exam Vitals reviewed.  Constitutional:      Appearance: Normal appearance. He is normal weight.  HENT:     Head: Normocephalic and atraumatic.     Right Ear: Tympanic membrane normal.     Left Ear: Tympanic membrane normal.     Nose: Nose normal.     Mouth/Throat:     Mouth: Mucous membranes are moist.     Pharynx: Oropharynx is  clear.  Eyes:     Extraocular Movements: Extraocular movements intact.     Conjunctiva/sclera: Conjunctivae normal.     Pupils: Pupils are equal, round, and reactive to light.  Cardiovascular:     Rate and Rhythm: Normal rate and regular rhythm.     Pulses: Normal pulses.     Heart sounds: Normal heart sounds.  Pulmonary:     Effort: Pulmonary effort is normal.  Abdominal:     Palpations: Abdomen is soft.  Musculoskeletal:     Cervical back: Normal range of motion and neck supple.  Skin:    General: Skin is warm and dry.  Neurological:     General: No focal deficit present.     Mental  Status: He is alert and oriented to person, place, and time.  Psychiatric:        Mood and Affect: Mood normal.        Behavior: Behavior normal.        Thought Content: Thought content normal.        Judgment: Judgment normal.     .  No results found for any visits on 07/01/22.  Assessment & Plan     1. Class 1 obesity due to excess calories without serious comorbidity with body mass index (BMI) of 31.0 to 31.9 in adult Phentermine and follow-up with first year - TSH - Lipid panel - CBC w/Diff/Platelet - Comprehensive Metabolic Panel (CMET)  2. Prostate cancer screening  - PSA  3. Gastroesophageal reflux disease, unspecified whether esophagitis present Continue Prilosec on a as needed basis. - TSH - Lipid panel - CBC w/Diff/Platelet - Comprehensive Metabolic Panel (CMET)  4. GAD (generalized anxiety disorder) Stable - TSH - Lipid panel - CBC w/Diff/Platelet - Comprehensive Metabolic Panel (CMET)    No follow-ups on file.      I, Ralph Durie, MD, have reviewed all documentation for this visit. The documentation on 07/05/22 for the exam, diagnosis, procedures, and orders are all accurate and complete.    Ralph Judy Cranford Mon, MD  [facility] [phone] (phone) [phone] (fax)  [place]

## 2022-07-05 ENCOUNTER — Other Ambulatory Visit: Payer: Self-pay

## 2022-07-05 MED ORDER — PHENTERMINE HCL 37.5 MG PO TABS
ORAL_TABLET | Freq: Every day | ORAL | 4 refills | Status: AC
  Filled 2022-07-05: qty 30, fill #0

## 2022-12-25 IMAGING — DX DG CHEST 1V PORT
2 series · 2 of 2 positions shown · non-contrast
Comparison: None.

CLINICAL DATA: Weakness

EXAM:
PORTABLE CHEST 1 VIEW

[chest ap (1 of 2)]
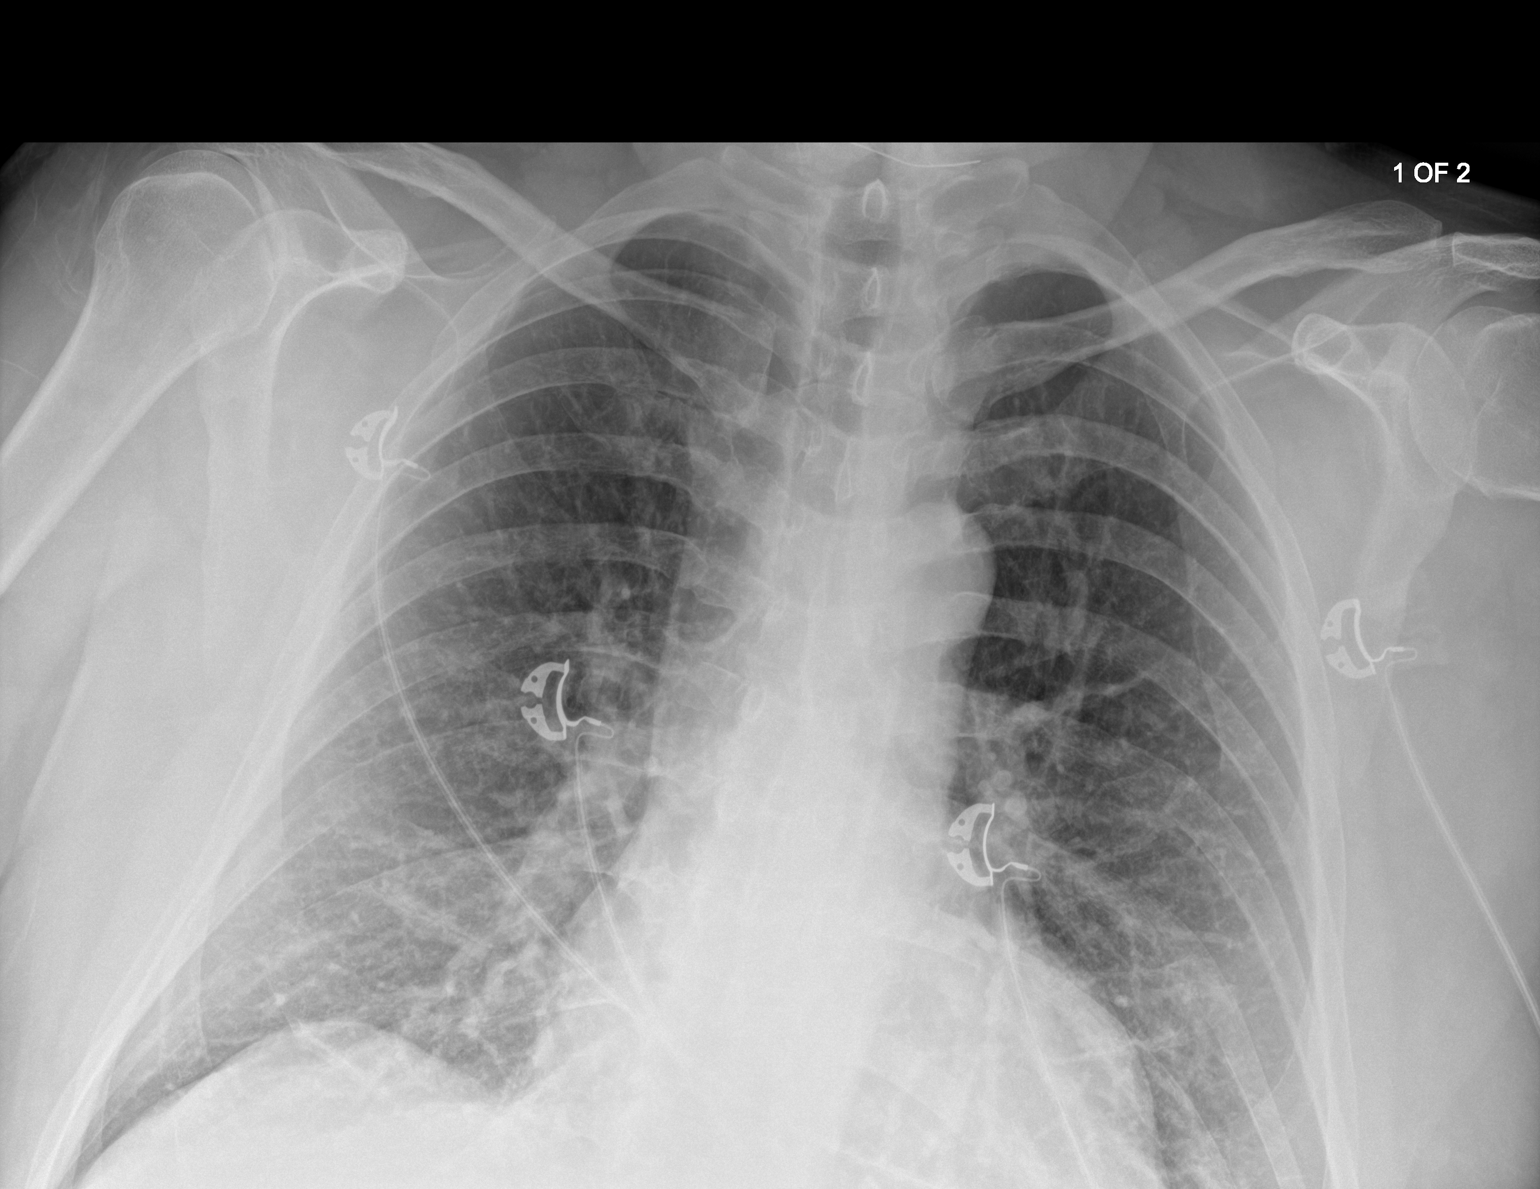

[chest ap (2 of 2)]
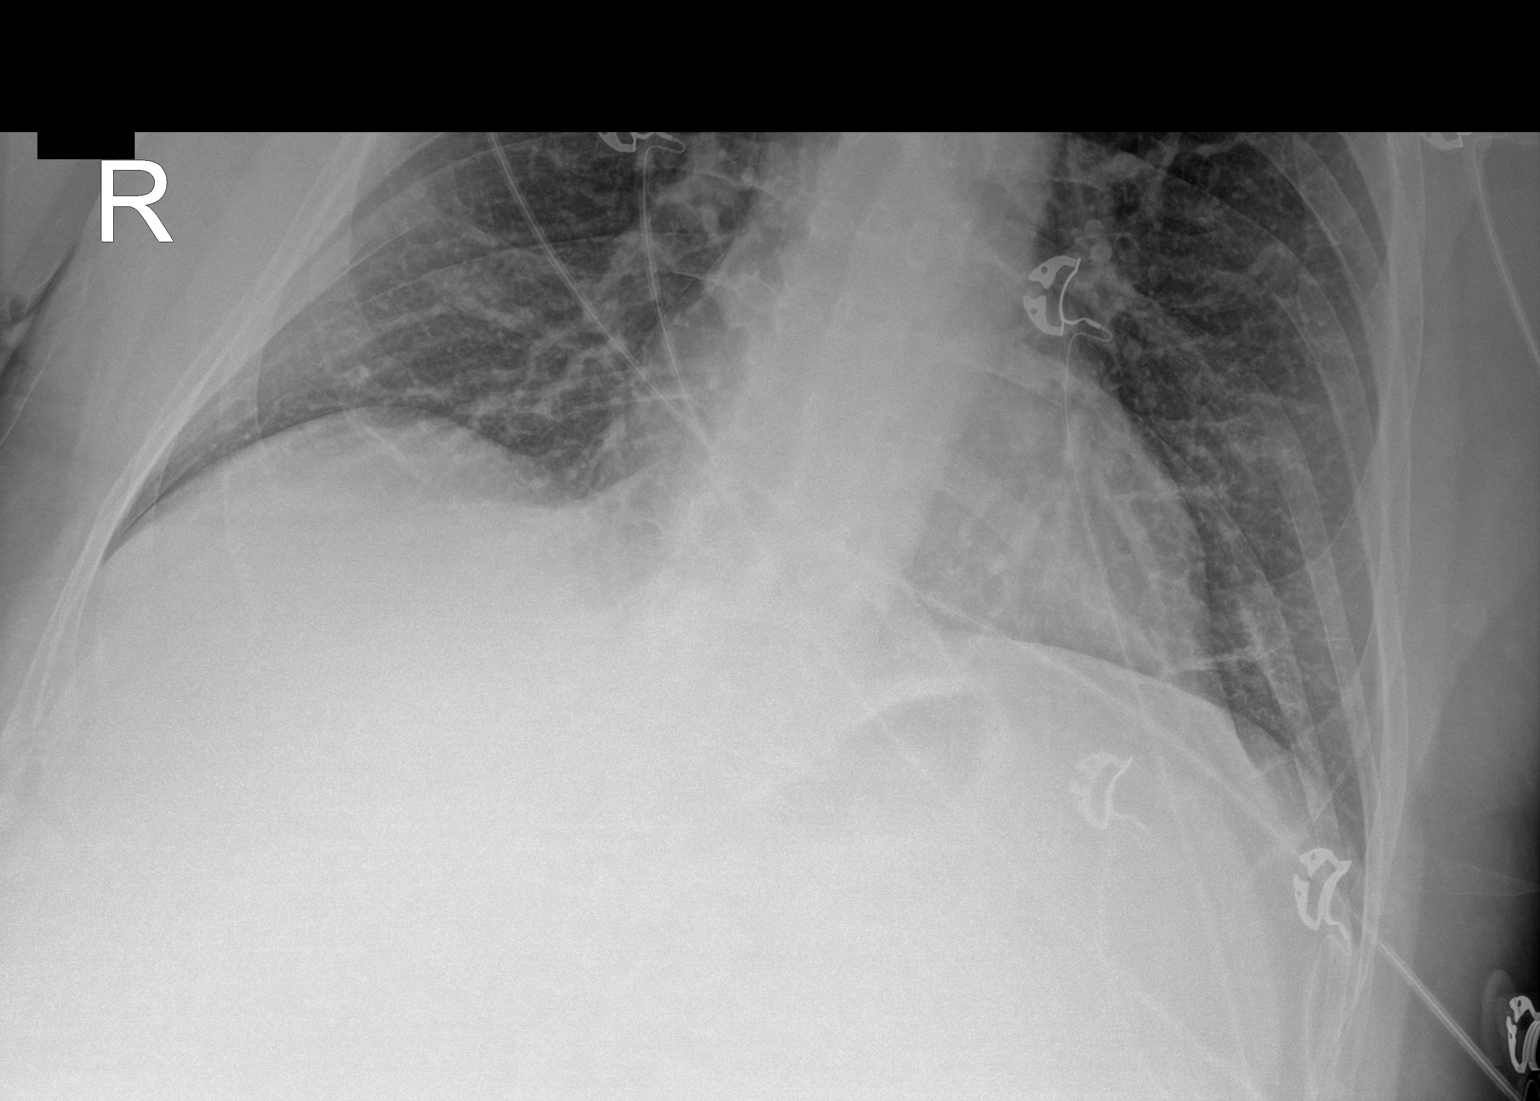

[2 of 2 positions shown; findings below may reference images not displayed]

FINDINGS: No consolidation or edema. No pleural effusion or pneumothorax.
Cardiomediastinal contours are within normal limits with normal
heart size. No acute osseous abnormality.
IMPRESSION: No acute process in the chest.

## 2023-02-26 ENCOUNTER — Other Ambulatory Visit: Payer: Self-pay

## 2023-02-26 MED ORDER — PANTOPRAZOLE SODIUM 40 MG PO TBEC
40.0000 mg | DELAYED_RELEASE_TABLET | Freq: Every day | ORAL | 11 refills | Status: AC
  Filled 2023-02-26: qty 90, 90d supply, fill #0
  Filled 2023-06-04: qty 90, 90d supply, fill #1
  Filled 2023-08-21: qty 90, 90d supply, fill #2
  Filled 2023-11-28: qty 90, 90d supply, fill #3

## 2023-02-27 DIAGNOSIS — D225 Melanocytic nevi of trunk: Secondary | ICD-10-CM | POA: Diagnosis not present

## 2023-02-27 DIAGNOSIS — D2271 Melanocytic nevi of right lower limb, including hip: Secondary | ICD-10-CM | POA: Diagnosis not present

## 2023-02-27 DIAGNOSIS — D2272 Melanocytic nevi of left lower limb, including hip: Secondary | ICD-10-CM | POA: Diagnosis not present

## 2023-02-27 DIAGNOSIS — L821 Other seborrheic keratosis: Secondary | ICD-10-CM | POA: Diagnosis not present

## 2023-02-27 DIAGNOSIS — D2261 Melanocytic nevi of right upper limb, including shoulder: Secondary | ICD-10-CM | POA: Diagnosis not present

## 2023-02-27 DIAGNOSIS — R238 Other skin changes: Secondary | ICD-10-CM | POA: Diagnosis not present

## 2023-02-27 DIAGNOSIS — D2262 Melanocytic nevi of left upper limb, including shoulder: Secondary | ICD-10-CM | POA: Diagnosis not present

## 2023-02-27 DIAGNOSIS — B078 Other viral warts: Secondary | ICD-10-CM | POA: Diagnosis not present

## 2023-05-07 DIAGNOSIS — G4733 Obstructive sleep apnea (adult) (pediatric): Secondary | ICD-10-CM | POA: Diagnosis not present

## 2023-05-08 ENCOUNTER — Other Ambulatory Visit: Payer: Self-pay

## 2023-05-08 MED ORDER — PHENTERMINE HCL 37.5 MG PO TABS
37.5000 mg | ORAL_TABLET | Freq: Every day | ORAL | 3 refills | Status: AC
  Filled 2023-05-08: qty 30, 30d supply, fill #0
  Filled 2023-06-04: qty 30, 30d supply, fill #1
  Filled 2023-07-22: qty 30, 30d supply, fill #2

## 2023-06-04 ENCOUNTER — Other Ambulatory Visit: Payer: Self-pay
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
## 2023-07-22 ENCOUNTER — Other Ambulatory Visit: Payer: Self-pay

## 2023-08-27 ENCOUNTER — Other Ambulatory Visit: Payer: Self-pay

## 2023-08-27 DIAGNOSIS — M25511 Pain in right shoulder: Secondary | ICD-10-CM | POA: Diagnosis not present

## 2023-08-27 DIAGNOSIS — E66811 Obesity, class 1: Secondary | ICD-10-CM | POA: Diagnosis not present

## 2023-08-27 DIAGNOSIS — E6609 Other obesity due to excess calories: Secondary | ICD-10-CM | POA: Diagnosis not present

## 2023-08-27 DIAGNOSIS — K219 Gastro-esophageal reflux disease without esophagitis: Secondary | ICD-10-CM | POA: Diagnosis not present

## 2023-08-27 DIAGNOSIS — G8929 Other chronic pain: Secondary | ICD-10-CM | POA: Diagnosis not present

## 2023-08-27 DIAGNOSIS — Z6832 Body mass index (BMI) 32.0-32.9, adult: Secondary | ICD-10-CM | POA: Diagnosis not present

## 2023-08-27 DIAGNOSIS — G4733 Obstructive sleep apnea (adult) (pediatric): Secondary | ICD-10-CM | POA: Diagnosis not present

## 2023-08-27 MED ORDER — MELOXICAM 15 MG PO TABS
15.0000 mg | ORAL_TABLET | Freq: Every day | ORAL | 5 refills | Status: AC
  Filled 2023-08-27: qty 30, 30d supply, fill #0
  Filled 2023-09-30: qty 30, 30d supply, fill #1
  Filled 2023-12-19: qty 30, 30d supply, fill #2
  Filled 2024-02-06: qty 30, 30d supply, fill #3
  Filled 2024-05-13: qty 30, 30d supply, fill #4

## 2023-08-27 MED ORDER — PHENTERMINE HCL 37.5 MG PO TABS
37.5000 mg | ORAL_TABLET | Freq: Every morning | ORAL | 3 refills | Status: AC
  Filled 2023-08-27: qty 30, 30d supply, fill #0
  Filled 2023-09-30: qty 30, 30d supply, fill #1
  Filled 2023-11-28: qty 30, 30d supply, fill #2

## 2023-09-30 ENCOUNTER — Other Ambulatory Visit: Payer: Self-pay

## 2023-10-01 ENCOUNTER — Other Ambulatory Visit: Payer: Self-pay

## 2023-10-01 MED ORDER — DOXYCYCLINE HYCLATE 100 MG PO CAPS
100.0000 mg | ORAL_CAPSULE | Freq: Two times a day (BID) | ORAL | 0 refills | Status: AC
  Filled 2023-10-01: qty 20, 10d supply, fill #0

## 2023-10-17 DIAGNOSIS — M62830 Muscle spasm of back: Secondary | ICD-10-CM | POA: Diagnosis not present

## 2023-10-17 DIAGNOSIS — M5412 Radiculopathy, cervical region: Secondary | ICD-10-CM | POA: Diagnosis not present

## 2023-10-17 DIAGNOSIS — M9901 Segmental and somatic dysfunction of cervical region: Secondary | ICD-10-CM | POA: Diagnosis not present

## 2023-10-17 DIAGNOSIS — M5033 Other cervical disc degeneration, cervicothoracic region: Secondary | ICD-10-CM | POA: Diagnosis not present

## 2023-10-20 DIAGNOSIS — M5033 Other cervical disc degeneration, cervicothoracic region: Secondary | ICD-10-CM | POA: Diagnosis not present

## 2023-10-20 DIAGNOSIS — M62830 Muscle spasm of back: Secondary | ICD-10-CM | POA: Diagnosis not present

## 2023-10-20 DIAGNOSIS — M9901 Segmental and somatic dysfunction of cervical region: Secondary | ICD-10-CM | POA: Diagnosis not present

## 2023-10-20 DIAGNOSIS — M5412 Radiculopathy, cervical region: Secondary | ICD-10-CM | POA: Diagnosis not present

## 2023-10-24 ENCOUNTER — Other Ambulatory Visit: Payer: Self-pay

## 2023-10-24 DIAGNOSIS — S39012A Strain of muscle, fascia and tendon of lower back, initial encounter: Secondary | ICD-10-CM | POA: Diagnosis not present

## 2023-10-24 MED ORDER — METHOCARBAMOL 750 MG PO TABS
750.0000 mg | ORAL_TABLET | Freq: Three times a day (TID) | ORAL | 0 refills | Status: AC
  Filled 2023-10-24: qty 60, 20d supply, fill #0

## 2023-10-24 MED ORDER — PREDNISONE 10 MG PO TABS
ORAL_TABLET | ORAL | 0 refills | Status: AC
  Filled 2023-10-24: qty 21, 6d supply, fill #0

## 2023-11-11 ENCOUNTER — Other Ambulatory Visit: Payer: Self-pay

## 2023-11-11 DIAGNOSIS — S39012A Strain of muscle, fascia and tendon of lower back, initial encounter: Secondary | ICD-10-CM | POA: Diagnosis not present

## 2023-11-11 MED ORDER — MELOXICAM 15 MG PO TABS
15.0000 mg | ORAL_TABLET | Freq: Every day | ORAL | 2 refills | Status: AC
  Filled 2023-11-11: qty 30, 30d supply, fill #0

## 2023-11-13 DIAGNOSIS — M545 Low back pain, unspecified: Secondary | ICD-10-CM | POA: Diagnosis not present

## 2023-11-13 DIAGNOSIS — M25651 Stiffness of right hip, not elsewhere classified: Secondary | ICD-10-CM | POA: Diagnosis not present

## 2023-11-13 DIAGNOSIS — M9905 Segmental and somatic dysfunction of pelvic region: Secondary | ICD-10-CM | POA: Diagnosis not present

## 2023-11-13 DIAGNOSIS — M7918 Myalgia, other site: Secondary | ICD-10-CM | POA: Diagnosis not present

## 2023-11-13 DIAGNOSIS — M9903 Segmental and somatic dysfunction of lumbar region: Secondary | ICD-10-CM | POA: Diagnosis not present

## 2023-11-13 DIAGNOSIS — M9904 Segmental and somatic dysfunction of sacral region: Secondary | ICD-10-CM | POA: Diagnosis not present

## 2023-11-13 DIAGNOSIS — M25652 Stiffness of left hip, not elsewhere classified: Secondary | ICD-10-CM | POA: Diagnosis not present

## 2023-11-17 DIAGNOSIS — M25652 Stiffness of left hip, not elsewhere classified: Secondary | ICD-10-CM | POA: Diagnosis not present

## 2023-11-17 DIAGNOSIS — M9904 Segmental and somatic dysfunction of sacral region: Secondary | ICD-10-CM | POA: Diagnosis not present

## 2023-11-17 DIAGNOSIS — M25651 Stiffness of right hip, not elsewhere classified: Secondary | ICD-10-CM | POA: Diagnosis not present

## 2023-11-17 DIAGNOSIS — M9905 Segmental and somatic dysfunction of pelvic region: Secondary | ICD-10-CM | POA: Diagnosis not present

## 2023-11-17 DIAGNOSIS — M545 Low back pain, unspecified: Secondary | ICD-10-CM | POA: Diagnosis not present

## 2023-11-17 DIAGNOSIS — M7918 Myalgia, other site: Secondary | ICD-10-CM | POA: Diagnosis not present

## 2023-11-17 DIAGNOSIS — M9903 Segmental and somatic dysfunction of lumbar region: Secondary | ICD-10-CM | POA: Diagnosis not present

## 2023-11-20 DIAGNOSIS — M25651 Stiffness of right hip, not elsewhere classified: Secondary | ICD-10-CM | POA: Diagnosis not present

## 2023-11-20 DIAGNOSIS — M9903 Segmental and somatic dysfunction of lumbar region: Secondary | ICD-10-CM | POA: Diagnosis not present

## 2023-11-20 DIAGNOSIS — M9905 Segmental and somatic dysfunction of pelvic region: Secondary | ICD-10-CM | POA: Diagnosis not present

## 2023-11-20 DIAGNOSIS — M25652 Stiffness of left hip, not elsewhere classified: Secondary | ICD-10-CM | POA: Diagnosis not present

## 2023-11-20 DIAGNOSIS — M7918 Myalgia, other site: Secondary | ICD-10-CM | POA: Diagnosis not present

## 2023-11-20 DIAGNOSIS — M9904 Segmental and somatic dysfunction of sacral region: Secondary | ICD-10-CM | POA: Diagnosis not present

## 2023-11-20 DIAGNOSIS — M545 Low back pain, unspecified: Secondary | ICD-10-CM | POA: Diagnosis not present

## 2023-11-25 DIAGNOSIS — M9904 Segmental and somatic dysfunction of sacral region: Secondary | ICD-10-CM | POA: Diagnosis not present

## 2023-11-25 DIAGNOSIS — M25651 Stiffness of right hip, not elsewhere classified: Secondary | ICD-10-CM | POA: Diagnosis not present

## 2023-11-25 DIAGNOSIS — M25652 Stiffness of left hip, not elsewhere classified: Secondary | ICD-10-CM | POA: Diagnosis not present

## 2023-11-25 DIAGNOSIS — M9903 Segmental and somatic dysfunction of lumbar region: Secondary | ICD-10-CM | POA: Diagnosis not present

## 2023-11-25 DIAGNOSIS — M7918 Myalgia, other site: Secondary | ICD-10-CM | POA: Diagnosis not present

## 2023-11-25 DIAGNOSIS — M9905 Segmental and somatic dysfunction of pelvic region: Secondary | ICD-10-CM | POA: Diagnosis not present

## 2023-11-25 DIAGNOSIS — M545 Low back pain, unspecified: Secondary | ICD-10-CM | POA: Diagnosis not present

## 2023-11-28 ENCOUNTER — Other Ambulatory Visit: Payer: Self-pay

## 2023-12-02 DIAGNOSIS — M25651 Stiffness of right hip, not elsewhere classified: Secondary | ICD-10-CM | POA: Diagnosis not present

## 2023-12-02 DIAGNOSIS — M9905 Segmental and somatic dysfunction of pelvic region: Secondary | ICD-10-CM | POA: Diagnosis not present

## 2023-12-02 DIAGNOSIS — M9903 Segmental and somatic dysfunction of lumbar region: Secondary | ICD-10-CM | POA: Diagnosis not present

## 2023-12-02 DIAGNOSIS — M9904 Segmental and somatic dysfunction of sacral region: Secondary | ICD-10-CM | POA: Diagnosis not present

## 2023-12-02 DIAGNOSIS — M25652 Stiffness of left hip, not elsewhere classified: Secondary | ICD-10-CM | POA: Diagnosis not present

## 2023-12-02 DIAGNOSIS — M7918 Myalgia, other site: Secondary | ICD-10-CM | POA: Diagnosis not present

## 2023-12-02 DIAGNOSIS — M545 Low back pain, unspecified: Secondary | ICD-10-CM | POA: Diagnosis not present

## 2023-12-11 DIAGNOSIS — M25511 Pain in right shoulder: Secondary | ICD-10-CM | POA: Diagnosis not present

## 2023-12-16 DIAGNOSIS — M9905 Segmental and somatic dysfunction of pelvic region: Secondary | ICD-10-CM | POA: Diagnosis not present

## 2023-12-16 DIAGNOSIS — M9903 Segmental and somatic dysfunction of lumbar region: Secondary | ICD-10-CM | POA: Diagnosis not present

## 2023-12-16 DIAGNOSIS — M25651 Stiffness of right hip, not elsewhere classified: Secondary | ICD-10-CM | POA: Diagnosis not present

## 2023-12-16 DIAGNOSIS — M25652 Stiffness of left hip, not elsewhere classified: Secondary | ICD-10-CM | POA: Diagnosis not present

## 2023-12-16 DIAGNOSIS — M7918 Myalgia, other site: Secondary | ICD-10-CM | POA: Diagnosis not present

## 2023-12-16 DIAGNOSIS — M545 Low back pain, unspecified: Secondary | ICD-10-CM | POA: Diagnosis not present

## 2023-12-16 DIAGNOSIS — M9904 Segmental and somatic dysfunction of sacral region: Secondary | ICD-10-CM | POA: Diagnosis not present

## 2023-12-30 DIAGNOSIS — M9903 Segmental and somatic dysfunction of lumbar region: Secondary | ICD-10-CM | POA: Diagnosis not present

## 2023-12-30 DIAGNOSIS — M9904 Segmental and somatic dysfunction of sacral region: Secondary | ICD-10-CM | POA: Diagnosis not present

## 2023-12-30 DIAGNOSIS — M7918 Myalgia, other site: Secondary | ICD-10-CM | POA: Diagnosis not present

## 2023-12-30 DIAGNOSIS — M25651 Stiffness of right hip, not elsewhere classified: Secondary | ICD-10-CM | POA: Diagnosis not present

## 2023-12-30 DIAGNOSIS — M545 Low back pain, unspecified: Secondary | ICD-10-CM | POA: Diagnosis not present

## 2023-12-30 DIAGNOSIS — M9905 Segmental and somatic dysfunction of pelvic region: Secondary | ICD-10-CM | POA: Diagnosis not present

## 2023-12-30 DIAGNOSIS — M25652 Stiffness of left hip, not elsewhere classified: Secondary | ICD-10-CM | POA: Diagnosis not present

## 2024-01-20 DIAGNOSIS — M545 Low back pain, unspecified: Secondary | ICD-10-CM | POA: Diagnosis not present

## 2024-01-20 DIAGNOSIS — M9904 Segmental and somatic dysfunction of sacral region: Secondary | ICD-10-CM | POA: Diagnosis not present

## 2024-01-20 DIAGNOSIS — M25651 Stiffness of right hip, not elsewhere classified: Secondary | ICD-10-CM | POA: Diagnosis not present

## 2024-01-20 DIAGNOSIS — M9903 Segmental and somatic dysfunction of lumbar region: Secondary | ICD-10-CM | POA: Diagnosis not present

## 2024-01-20 DIAGNOSIS — M7918 Myalgia, other site: Secondary | ICD-10-CM | POA: Diagnosis not present

## 2024-01-20 DIAGNOSIS — M9905 Segmental and somatic dysfunction of pelvic region: Secondary | ICD-10-CM | POA: Diagnosis not present

## 2024-01-20 DIAGNOSIS — M25652 Stiffness of left hip, not elsewhere classified: Secondary | ICD-10-CM | POA: Diagnosis not present

## 2024-02-17 ENCOUNTER — Other Ambulatory Visit: Payer: Self-pay

## 2024-02-17 DIAGNOSIS — M25652 Stiffness of left hip, not elsewhere classified: Secondary | ICD-10-CM | POA: Diagnosis not present

## 2024-02-17 DIAGNOSIS — M545 Low back pain, unspecified: Secondary | ICD-10-CM | POA: Diagnosis not present

## 2024-02-17 DIAGNOSIS — G4733 Obstructive sleep apnea (adult) (pediatric): Secondary | ICD-10-CM | POA: Diagnosis not present

## 2024-02-17 DIAGNOSIS — M9905 Segmental and somatic dysfunction of pelvic region: Secondary | ICD-10-CM | POA: Diagnosis not present

## 2024-02-17 DIAGNOSIS — M7918 Myalgia, other site: Secondary | ICD-10-CM | POA: Diagnosis not present

## 2024-02-17 DIAGNOSIS — E66811 Obesity, class 1: Secondary | ICD-10-CM | POA: Diagnosis not present

## 2024-02-17 DIAGNOSIS — Z6832 Body mass index (BMI) 32.0-32.9, adult: Secondary | ICD-10-CM | POA: Diagnosis not present

## 2024-02-17 DIAGNOSIS — M25651 Stiffness of right hip, not elsewhere classified: Secondary | ICD-10-CM | POA: Diagnosis not present

## 2024-02-17 DIAGNOSIS — M9904 Segmental and somatic dysfunction of sacral region: Secondary | ICD-10-CM | POA: Diagnosis not present

## 2024-02-17 DIAGNOSIS — K219 Gastro-esophageal reflux disease without esophagitis: Secondary | ICD-10-CM | POA: Diagnosis not present

## 2024-02-17 DIAGNOSIS — M9903 Segmental and somatic dysfunction of lumbar region: Secondary | ICD-10-CM | POA: Diagnosis not present

## 2024-02-17 DIAGNOSIS — E6609 Other obesity due to excess calories: Secondary | ICD-10-CM | POA: Diagnosis not present

## 2024-02-17 MED ORDER — PHENTERMINE HCL 37.5 MG PO TABS
37.5000 mg | ORAL_TABLET | Freq: Every morning | ORAL | 3 refills | Status: AC
  Filled 2024-02-17: qty 30, 30d supply, fill #0
  Filled 2024-05-13: qty 30, 30d supply, fill #1
  Filled 2024-07-14: qty 30, 30d supply, fill #2

## 2024-03-02 DIAGNOSIS — M9903 Segmental and somatic dysfunction of lumbar region: Secondary | ICD-10-CM | POA: Diagnosis not present

## 2024-03-02 DIAGNOSIS — M25651 Stiffness of right hip, not elsewhere classified: Secondary | ICD-10-CM | POA: Diagnosis not present

## 2024-03-02 DIAGNOSIS — M7918 Myalgia, other site: Secondary | ICD-10-CM | POA: Diagnosis not present

## 2024-03-02 DIAGNOSIS — M9905 Segmental and somatic dysfunction of pelvic region: Secondary | ICD-10-CM | POA: Diagnosis not present

## 2024-03-02 DIAGNOSIS — M545 Low back pain, unspecified: Secondary | ICD-10-CM | POA: Diagnosis not present

## 2024-03-02 DIAGNOSIS — M9904 Segmental and somatic dysfunction of sacral region: Secondary | ICD-10-CM | POA: Diagnosis not present

## 2024-03-02 DIAGNOSIS — M25652 Stiffness of left hip, not elsewhere classified: Secondary | ICD-10-CM | POA: Diagnosis not present

## 2024-03-04 DIAGNOSIS — D225 Melanocytic nevi of trunk: Secondary | ICD-10-CM | POA: Diagnosis not present

## 2024-03-04 DIAGNOSIS — D2261 Melanocytic nevi of right upper limb, including shoulder: Secondary | ICD-10-CM | POA: Diagnosis not present

## 2024-03-04 DIAGNOSIS — D2262 Melanocytic nevi of left upper limb, including shoulder: Secondary | ICD-10-CM | POA: Diagnosis not present

## 2024-03-04 DIAGNOSIS — D2272 Melanocytic nevi of left lower limb, including hip: Secondary | ICD-10-CM | POA: Diagnosis not present

## 2024-03-04 DIAGNOSIS — D2271 Melanocytic nevi of right lower limb, including hip: Secondary | ICD-10-CM | POA: Diagnosis not present

## 2024-03-04 DIAGNOSIS — L821 Other seborrheic keratosis: Secondary | ICD-10-CM | POA: Diagnosis not present

## 2024-03-16 DIAGNOSIS — M545 Low back pain, unspecified: Secondary | ICD-10-CM | POA: Diagnosis not present

## 2024-03-16 DIAGNOSIS — M25651 Stiffness of right hip, not elsewhere classified: Secondary | ICD-10-CM | POA: Diagnosis not present

## 2024-03-16 DIAGNOSIS — M9903 Segmental and somatic dysfunction of lumbar region: Secondary | ICD-10-CM | POA: Diagnosis not present

## 2024-03-16 DIAGNOSIS — M25652 Stiffness of left hip, not elsewhere classified: Secondary | ICD-10-CM | POA: Diagnosis not present

## 2024-03-16 DIAGNOSIS — M9904 Segmental and somatic dysfunction of sacral region: Secondary | ICD-10-CM | POA: Diagnosis not present

## 2024-03-16 DIAGNOSIS — M9905 Segmental and somatic dysfunction of pelvic region: Secondary | ICD-10-CM | POA: Diagnosis not present

## 2024-03-16 DIAGNOSIS — M7918 Myalgia, other site: Secondary | ICD-10-CM | POA: Diagnosis not present

## 2024-03-17 DIAGNOSIS — Z0000 Encounter for general adult medical examination without abnormal findings: Secondary | ICD-10-CM | POA: Diagnosis not present

## 2024-03-17 DIAGNOSIS — Z125 Encounter for screening for malignant neoplasm of prostate: Secondary | ICD-10-CM | POA: Diagnosis not present

## 2024-03-23 DIAGNOSIS — Z23 Encounter for immunization: Secondary | ICD-10-CM | POA: Diagnosis not present

## 2024-03-23 DIAGNOSIS — Z1331 Encounter for screening for depression: Secondary | ICD-10-CM | POA: Diagnosis not present

## 2024-03-23 DIAGNOSIS — Z0000 Encounter for general adult medical examination without abnormal findings: Secondary | ICD-10-CM | POA: Diagnosis not present

## 2024-03-23 DIAGNOSIS — Z1211 Encounter for screening for malignant neoplasm of colon: Secondary | ICD-10-CM | POA: Diagnosis not present

## 2024-03-25 ENCOUNTER — Other Ambulatory Visit: Payer: Self-pay

## 2024-03-25 MED ORDER — PANTOPRAZOLE SODIUM 40 MG PO TBEC
40.0000 mg | DELAYED_RELEASE_TABLET | Freq: Every day | ORAL | 11 refills | Status: AC
  Filled 2024-03-25: qty 90, 90d supply, fill #0
  Filled 2024-07-14: qty 90, 90d supply, fill #1
  Filled 2024-11-03: qty 90, 90d supply, fill #2

## 2024-03-25 MED ORDER — PANTOPRAZOLE SODIUM 40 MG PO TBEC: 40.0000 mg | DELAYED_RELEASE_TABLET | Freq: Every day | ORAL | 11 refills | Status: AC

## 2024-03-30 DIAGNOSIS — M9903 Segmental and somatic dysfunction of lumbar region: Secondary | ICD-10-CM | POA: Diagnosis not present

## 2024-03-30 DIAGNOSIS — M25652 Stiffness of left hip, not elsewhere classified: Secondary | ICD-10-CM | POA: Diagnosis not present

## 2024-03-30 DIAGNOSIS — M7918 Myalgia, other site: Secondary | ICD-10-CM | POA: Diagnosis not present

## 2024-03-30 DIAGNOSIS — M25651 Stiffness of right hip, not elsewhere classified: Secondary | ICD-10-CM | POA: Diagnosis not present

## 2024-03-30 DIAGNOSIS — M9905 Segmental and somatic dysfunction of pelvic region: Secondary | ICD-10-CM | POA: Diagnosis not present

## 2024-03-30 DIAGNOSIS — M9904 Segmental and somatic dysfunction of sacral region: Secondary | ICD-10-CM | POA: Diagnosis not present

## 2024-03-30 DIAGNOSIS — M545 Low back pain, unspecified: Secondary | ICD-10-CM | POA: Diagnosis not present

## 2024-04-13 DIAGNOSIS — M9905 Segmental and somatic dysfunction of pelvic region: Secondary | ICD-10-CM | POA: Diagnosis not present

## 2024-04-13 DIAGNOSIS — M9903 Segmental and somatic dysfunction of lumbar region: Secondary | ICD-10-CM | POA: Diagnosis not present

## 2024-04-13 DIAGNOSIS — M9904 Segmental and somatic dysfunction of sacral region: Secondary | ICD-10-CM | POA: Diagnosis not present

## 2024-04-13 DIAGNOSIS — M7918 Myalgia, other site: Secondary | ICD-10-CM | POA: Diagnosis not present

## 2024-04-13 DIAGNOSIS — M25652 Stiffness of left hip, not elsewhere classified: Secondary | ICD-10-CM | POA: Diagnosis not present

## 2024-04-13 DIAGNOSIS — M545 Low back pain, unspecified: Secondary | ICD-10-CM | POA: Diagnosis not present

## 2024-04-13 DIAGNOSIS — M25651 Stiffness of right hip, not elsewhere classified: Secondary | ICD-10-CM | POA: Diagnosis not present

## 2024-04-21 LAB — COLOGUARD

## 2024-05-04 DIAGNOSIS — M25651 Stiffness of right hip, not elsewhere classified: Secondary | ICD-10-CM | POA: Diagnosis not present

## 2024-05-04 DIAGNOSIS — M25652 Stiffness of left hip, not elsewhere classified: Secondary | ICD-10-CM | POA: Diagnosis not present

## 2024-05-04 DIAGNOSIS — M545 Low back pain, unspecified: Secondary | ICD-10-CM | POA: Diagnosis not present

## 2024-05-04 DIAGNOSIS — M9903 Segmental and somatic dysfunction of lumbar region: Secondary | ICD-10-CM | POA: Diagnosis not present

## 2024-05-04 DIAGNOSIS — M9904 Segmental and somatic dysfunction of sacral region: Secondary | ICD-10-CM | POA: Diagnosis not present

## 2024-05-04 DIAGNOSIS — M9905 Segmental and somatic dysfunction of pelvic region: Secondary | ICD-10-CM | POA: Diagnosis not present

## 2024-05-04 DIAGNOSIS — M7918 Myalgia, other site: Secondary | ICD-10-CM | POA: Diagnosis not present

## 2024-05-12 DIAGNOSIS — Z1211 Encounter for screening for malignant neoplasm of colon: Secondary | ICD-10-CM | POA: Diagnosis not present

## 2024-05-13 ENCOUNTER — Other Ambulatory Visit: Payer: Self-pay

## 2024-05-18 LAB — COLOGUARD: COLOGUARD: NEGATIVE

## 2024-05-21 DIAGNOSIS — M9904 Segmental and somatic dysfunction of sacral region: Secondary | ICD-10-CM | POA: Diagnosis not present

## 2024-05-21 DIAGNOSIS — M7918 Myalgia, other site: Secondary | ICD-10-CM | POA: Diagnosis not present

## 2024-05-21 DIAGNOSIS — M545 Low back pain, unspecified: Secondary | ICD-10-CM | POA: Diagnosis not present

## 2024-05-21 DIAGNOSIS — M25652 Stiffness of left hip, not elsewhere classified: Secondary | ICD-10-CM | POA: Diagnosis not present

## 2024-05-21 DIAGNOSIS — M25651 Stiffness of right hip, not elsewhere classified: Secondary | ICD-10-CM | POA: Diagnosis not present

## 2024-05-21 DIAGNOSIS — M9903 Segmental and somatic dysfunction of lumbar region: Secondary | ICD-10-CM | POA: Diagnosis not present

## 2024-05-21 DIAGNOSIS — M9905 Segmental and somatic dysfunction of pelvic region: Secondary | ICD-10-CM | POA: Diagnosis not present

## 2024-06-08 DIAGNOSIS — M25652 Stiffness of left hip, not elsewhere classified: Secondary | ICD-10-CM | POA: Diagnosis not present

## 2024-06-08 DIAGNOSIS — M545 Low back pain, unspecified: Secondary | ICD-10-CM | POA: Diagnosis not present

## 2024-06-08 DIAGNOSIS — M9904 Segmental and somatic dysfunction of sacral region: Secondary | ICD-10-CM | POA: Diagnosis not present

## 2024-06-08 DIAGNOSIS — M9903 Segmental and somatic dysfunction of lumbar region: Secondary | ICD-10-CM | POA: Diagnosis not present

## 2024-06-08 DIAGNOSIS — M9905 Segmental and somatic dysfunction of pelvic region: Secondary | ICD-10-CM | POA: Diagnosis not present

## 2024-06-08 DIAGNOSIS — M25651 Stiffness of right hip, not elsewhere classified: Secondary | ICD-10-CM | POA: Diagnosis not present

## 2024-06-08 DIAGNOSIS — M7918 Myalgia, other site: Secondary | ICD-10-CM | POA: Diagnosis not present

## 2024-07-06 DIAGNOSIS — M9903 Segmental and somatic dysfunction of lumbar region: Secondary | ICD-10-CM | POA: Diagnosis not present

## 2024-07-06 DIAGNOSIS — M9905 Segmental and somatic dysfunction of pelvic region: Secondary | ICD-10-CM | POA: Diagnosis not present

## 2024-07-06 DIAGNOSIS — M9904 Segmental and somatic dysfunction of sacral region: Secondary | ICD-10-CM | POA: Diagnosis not present

## 2024-07-06 DIAGNOSIS — M7918 Myalgia, other site: Secondary | ICD-10-CM | POA: Diagnosis not present

## 2024-07-06 DIAGNOSIS — M545 Low back pain, unspecified: Secondary | ICD-10-CM | POA: Diagnosis not present

## 2024-07-06 DIAGNOSIS — M25651 Stiffness of right hip, not elsewhere classified: Secondary | ICD-10-CM | POA: Diagnosis not present

## 2024-07-06 DIAGNOSIS — M25652 Stiffness of left hip, not elsewhere classified: Secondary | ICD-10-CM | POA: Diagnosis not present

## 2024-07-08 DIAGNOSIS — H524 Presbyopia: Secondary | ICD-10-CM | POA: Diagnosis not present

## 2024-07-14 ENCOUNTER — Other Ambulatory Visit: Payer: Self-pay

## 2024-07-27 DIAGNOSIS — M9904 Segmental and somatic dysfunction of sacral region: Secondary | ICD-10-CM | POA: Diagnosis not present

## 2024-07-27 DIAGNOSIS — M25652 Stiffness of left hip, not elsewhere classified: Secondary | ICD-10-CM | POA: Diagnosis not present

## 2024-07-27 DIAGNOSIS — M545 Low back pain, unspecified: Secondary | ICD-10-CM | POA: Diagnosis not present

## 2024-07-27 DIAGNOSIS — M7918 Myalgia, other site: Secondary | ICD-10-CM | POA: Diagnosis not present

## 2024-07-27 DIAGNOSIS — M9903 Segmental and somatic dysfunction of lumbar region: Secondary | ICD-10-CM | POA: Diagnosis not present

## 2024-07-27 DIAGNOSIS — M25651 Stiffness of right hip, not elsewhere classified: Secondary | ICD-10-CM | POA: Diagnosis not present

## 2024-07-27 DIAGNOSIS — M9905 Segmental and somatic dysfunction of pelvic region: Secondary | ICD-10-CM | POA: Diagnosis not present

## 2024-08-09 ENCOUNTER — Ambulatory Visit
Admission: EM | Admit: 2024-08-09 | Discharge: 2024-08-09 | Disposition: A | Discharge status: Home / Self Care | Attending: Emergency Medicine | Admitting: Emergency Medicine

## 2024-08-09 ENCOUNTER — Ambulatory Visit (INDEPENDENT_AMBULATORY_CARE_PROVIDER_SITE_OTHER)

## 2024-08-09 ENCOUNTER — Other Ambulatory Visit: Payer: Self-pay

## 2024-08-09 ENCOUNTER — Ambulatory Visit (HOSPITAL_COMMUNITY): Payer: Self-pay

## 2024-08-09 DIAGNOSIS — R058 Other specified cough: Secondary | ICD-10-CM | POA: Diagnosis not present

## 2024-08-09 DIAGNOSIS — J029 Acute pharyngitis, unspecified: Secondary | ICD-10-CM | POA: Diagnosis not present

## 2024-08-09 DIAGNOSIS — R0989 Other specified symptoms and signs involving the circulatory and respiratory systems: Secondary | ICD-10-CM | POA: Diagnosis not present

## 2024-08-09 MED ORDER — ALBUTEROL SULFATE HFA 108 (90 BASE) MCG/ACT IN AERS
1.0000 | INHALATION_SPRAY | Freq: Four times a day (QID) | RESPIRATORY_TRACT | 0 refills | Status: AC | PRN
  Filled 2024-08-09: qty 18, 30d supply, fill #0

## 2024-08-09 MED ORDER — AMOXICILLIN-POT CLAVULANATE 875-125 MG PO TABS
1.0000 | ORAL_TABLET | Freq: Two times a day (BID) | ORAL | 0 refills | Status: AC
  Filled 2024-08-09: qty 14, 7d supply, fill #0

## 2024-08-09 NOTE — Discharge Instructions (Signed)
 Follow up with your primary care provider tomorrow.  Go to the emergency department if you have worsening symptoms.    Take the Augmentin  and use the albuterol  inhaler as directed.

## 2024-08-09 NOTE — ED Triage Notes (Signed)
 Patient to Urgent Care with complaints of runny nose/ sore throat/ productive cough/ chest congestion. Max temp 99.3.   Symptoms x3 weeks.   Meds: mucinex dm/ delsym/ dayquil/ nyquil/ vicks vapor rubs/ using humidifier
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
## 2024-08-10 DIAGNOSIS — M9904 Segmental and somatic dysfunction of sacral region: Secondary | ICD-10-CM | POA: Diagnosis not present

## 2024-08-10 DIAGNOSIS — M25651 Stiffness of right hip, not elsewhere classified: Secondary | ICD-10-CM | POA: Diagnosis not present

## 2024-08-10 DIAGNOSIS — M9903 Segmental and somatic dysfunction of lumbar region: Secondary | ICD-10-CM | POA: Diagnosis not present

## 2024-08-10 DIAGNOSIS — M7918 Myalgia, other site: Secondary | ICD-10-CM | POA: Diagnosis not present

## 2024-08-10 DIAGNOSIS — M9905 Segmental and somatic dysfunction of pelvic region: Secondary | ICD-10-CM | POA: Diagnosis not present

## 2024-08-10 DIAGNOSIS — M545 Low back pain, unspecified: Secondary | ICD-10-CM | POA: Diagnosis not present

## 2024-08-10 DIAGNOSIS — M25652 Stiffness of left hip, not elsewhere classified: Secondary | ICD-10-CM | POA: Diagnosis not present

## 2024-08-31 DIAGNOSIS — M9905 Segmental and somatic dysfunction of pelvic region: Secondary | ICD-10-CM | POA: Diagnosis not present

## 2024-08-31 DIAGNOSIS — M25651 Stiffness of right hip, not elsewhere classified: Secondary | ICD-10-CM | POA: Diagnosis not present

## 2024-08-31 DIAGNOSIS — M9904 Segmental and somatic dysfunction of sacral region: Secondary | ICD-10-CM | POA: Diagnosis not present

## 2024-08-31 DIAGNOSIS — M545 Low back pain, unspecified: Secondary | ICD-10-CM | POA: Diagnosis not present

## 2024-08-31 DIAGNOSIS — M9903 Segmental and somatic dysfunction of lumbar region: Secondary | ICD-10-CM | POA: Diagnosis not present

## 2024-08-31 DIAGNOSIS — M7918 Myalgia, other site: Secondary | ICD-10-CM | POA: Diagnosis not present

## 2024-08-31 DIAGNOSIS — M25652 Stiffness of left hip, not elsewhere classified: Secondary | ICD-10-CM | POA: Diagnosis not present

## 2024-09-21 DIAGNOSIS — M9904 Segmental and somatic dysfunction of sacral region: Secondary | ICD-10-CM | POA: Diagnosis not present

## 2024-09-21 DIAGNOSIS — M25651 Stiffness of right hip, not elsewhere classified: Secondary | ICD-10-CM | POA: Diagnosis not present

## 2024-09-21 DIAGNOSIS — M25652 Stiffness of left hip, not elsewhere classified: Secondary | ICD-10-CM | POA: Diagnosis not present

## 2024-09-21 DIAGNOSIS — M7918 Myalgia, other site: Secondary | ICD-10-CM | POA: Diagnosis not present

## 2024-09-21 DIAGNOSIS — M9903 Segmental and somatic dysfunction of lumbar region: Secondary | ICD-10-CM | POA: Diagnosis not present

## 2024-09-21 DIAGNOSIS — M545 Low back pain, unspecified: Secondary | ICD-10-CM | POA: Diagnosis not present

## 2024-09-21 DIAGNOSIS — M9905 Segmental and somatic dysfunction of pelvic region: Secondary | ICD-10-CM | POA: Diagnosis not present
